# Patient Record
Sex: Male | Born: 1970
Health system: Southern US, Community
[De-identification: ages and names within clinical notes are randomized; demographics above are authoritative.]

## PROBLEM LIST (undated history)

## (undated) DIAGNOSIS — Z8669 Personal history of other diseases of the nervous system and sense organs: Secondary | ICD-10-CM

## (undated) DIAGNOSIS — J45909 Unspecified asthma, uncomplicated: Secondary | ICD-10-CM

## (undated) DIAGNOSIS — E785 Hyperlipidemia, unspecified: Secondary | ICD-10-CM

## (undated) HISTORY — PX: WISDOM TOOTH EXTRACTION: SHX21

## (undated) HISTORY — DX: Hyperlipidemia, unspecified: E78.5

## (undated) HISTORY — DX: Personal history of other diseases of the nervous system and sense organs: Z86.69

## (undated) HISTORY — DX: Unspecified asthma, uncomplicated: J45.909

---

## 2017-12-03 ENCOUNTER — Encounter: Payer: Self-pay | Admitting: Family Medicine

## 2017-12-03 ENCOUNTER — Ambulatory Visit (INDEPENDENT_AMBULATORY_CARE_PROVIDER_SITE_OTHER): Payer: Self-pay | Admitting: Family Medicine

## 2017-12-03 VITALS — BP 122/78 | HR 59 | Temp 98.8°F | Wt 234.4 lb

## 2017-12-03 DIAGNOSIS — R059 Cough, unspecified: Secondary | ICD-10-CM

## 2017-12-03 DIAGNOSIS — R05 Cough: Secondary | ICD-10-CM

## 2017-12-03 DIAGNOSIS — Z8709 Personal history of other diseases of the respiratory system: Secondary | ICD-10-CM

## 2017-12-03 DIAGNOSIS — F429 Obsessive-compulsive disorder, unspecified: Secondary | ICD-10-CM

## 2017-12-03 DIAGNOSIS — Z760 Encounter for issue of repeat prescription: Secondary | ICD-10-CM

## 2017-12-03 MED ORDER — MONTELUKAST SODIUM 10 MG PO TABS
10.0000 mg | ORAL_TABLET | Freq: Every day | ORAL | 0 refills | Status: DC
Start: 2017-12-03 — End: 2017-12-17

## 2017-12-03 MED ORDER — CITALOPRAM HYDROBROMIDE 40 MG PO TABS: 40.0000 mg | ORAL_TABLET | Freq: Every day | ORAL | 0 refills | Status: DC

## 2017-12-03 NOTE — Patient Instructions (Addendum)
Montelukast oral tablets What is this medicine? MONTELUKAST (mon te LOO kast) is used to prevent and treat the symptoms of asthma. It is also used to treat allergies. Do not use for an acute asthma attack. This medicine may be used for other purposes; ask your health care provider or pharmacist if you have questions. COMMON BRAND NAME(S): Singulair What should I tell my health care provider before I take this medicine? They need to know if you have any of these conditions: -liver disease -an unusual or allergic reaction to montelukast, other medicines, foods, dyes, or preservatives -pregnant or trying to get pregnant -breast-feeding How should I use this medicine? This medicine should be given by mouth. Follow the directions on the prescription label. Take this medicine at the same time every day. You may take this medicine with or without meals. Do not chew the tablets. Do not stop taking your medicine unless your doctor tells you to. Talk to your pediatrician regarding the use of this medicine in children. Special care may be needed. While this drug may be prescribed for children as young as 15 years of age for selected conditions, precautions do apply. Overdosage: If you think you have taken too much of this medicine contact a poison control center or emergency room at once. NOTE: This medicine is only for you. Do not share this medicine with others. What if I miss a dose? If you miss a dose, take it as soon as you can. If it is almost time for your next dose, take only that dose. Do not take double or extra doses. What may interact with this medicine? -anti-infectives like rifampin and rifabutin -medicines for diabetes like rosiglitazone and repaglinide -medicines for seizures like phenytoin, phenobarbital, and carbamazepine -paclitaxel This list may not describe all possible interactions. Give your health care provider a list of all the medicines, herbs, non-prescription drugs, or  dietary supplements you use. Also tell them if you smoke, drink alcohol, or use illegal drugs. Some items may interact with your medicine. What should I watch for while using this medicine? Visit your doctor or health care professional for regular checks on your progress. Tell your doctor or health care professional if your allergy or asthma symptoms do not improve. Take your medicine even when you do not have symptoms. Do not stop taking any of your medicine(s) unless your doctor tells you to. If you have asthma, talk to your doctor about what to do in an acute asthma attack. Always have your inhaled rescue medicine for asthma attacks with you. Patients and their families should watch for new or worsening thoughts of suicide or depression. Also watch for sudden changes in feelings such as feeling anxious, agitated, panicky, irritable, hostile, aggressive, impulsive, severely restless, overly excited and hyperactive, or not being able to sleep. Any worsening of mood or thoughts of suicide or dying should be reported to your health care professional right away. What side effects may I notice from receiving this medicine? Side effects that you should report to your doctor or health care professional as soon as possible: -allergic reactions like skin rash or hives, or swelling of the face, lips, or tongue -breathing problems -confusion -dark urine -fever or infection -flu-like symptoms -hallucinations -painful lumps under the skin -pain, tingling, numbness in the hands or feet -sinus pain or swelling -suicidal thoughts or other mood changes -tremors -trouble sleeping -uncontrolled muscle movements -unusual bleeding or bruising -yellowing of the eyes or skin Side effects that usually do not require   medical attention (report to your doctor or health care professional if they continue or are bothersome): -cough -dizziness -drowsiness -headache -nightmares -stomach upset -stuffy nose This list may  not describe all possible side effects. Call your doctor for medical advice about side effects. You may report side effects to FDA at 1-800-FDA-1088. Where should I keep my medicine? Keep out of the reach of children. Store at room temperature between 15 and 30 degrees C (59 and 86 degrees F). Protect from light and moisture. Keep this medicine in the original bottle. Throw away any unused medicine after the expiration date. NOTE: This sheet is a summary. It may not cover all possible information. If you have questions about this medicine, talk to your doctor, pharmacist, or health care provider.  2018 Elsevier/Gold Standard (2015-07-18 09:40:44) Citalopram tablets What is this medicine? CITALOPRAM (sye TAL oh pram) is a medicine for depression. This medicine may be used for other purposes; ask your health care provider or pharmacist if you have questions. COMMON BRAND NAME(S): Celexa What should I tell my health care provider before I take this medicine? They need to know if you have any of these conditions: -bleeding disorders -bipolar disorder or a family history of bipolar disorder -glaucoma -heart disease -history of irregular heartbeat -kidney disease -liver disease -low levels of magnesium or potassium in the blood -receiving electroconvulsive therapy -seizures -suicidal thoughts, plans, or attempt; a previous suicide attempt by you or a family member -take medicines that treat or prevent blood clots -thyroid disease -an unusual or allergic reaction to citalopram, escitalopram, other medicines, foods, dyes, or preservatives -pregnant or trying to become pregnant -breast-feeding How should I use this medicine? Take this medicine by mouth with a glass of water. Follow the directions on the prescription label. You can take it with or without food. Take your medicine at regular intervals. Do not take your medicine more often than directed. Do not stop taking this medicine suddenly  except upon the advice of your doctor. Stopping this medicine too quickly may cause serious side effects or your condition may worsen. A special MedGuide will be given to you by the pharmacist with each prescription and refill. Be sure to read this information carefully each time. Talk to your pediatrician regarding the use of this medicine in children. Special care may be needed. Patients over 25 years old may have a stronger reaction and need a smaller dose. Overdosage: If you think you have taken too much of this medicine contact a poison control center or emergency room at once. NOTE: This medicine is only for you. Do not share this medicine with others. What if I miss a dose? If you miss a dose, take it as soon as you can. If it is almost time for your next dose, take only that dose. Do not take double or extra doses. What may interact with this medicine? Do not take this medicine with any of the following medications: -certain medicines for fungal infections like fluconazole, itraconazole, ketoconazole, posaconazole, voriconazole -cisapride -dofetilide -dronedarone -escitalopram -linezolid -MAOIs like Carbex, Eldepryl, Marplan, Nardil, and Parnate -methylene blue (injected into a vein) -pimozide -thioridazine -ziprasidone This medicine may also interact with the following medications: -alcohol -amphetamines -aspirin and aspirin-like medicines -carbamazepine -certain medicines for depression, anxiety, or psychotic disturbances -certain medicines for infections like chloroquine, clarithromycin, erythromycin, furazolidone, isoniazid, pentamidine -certain medicines for migraine headaches like almotriptan, eletriptan, frovatriptan, naratriptan, rizatriptan, sumatriptan, zolmitriptan -certain medicines for sleep -certain medicines that treat or prevent blood clots like dalteparin,  enoxaparin, warfarin -cimetidine -diuretics -fentanyl -lithium -methadone -metoprolol -NSAIDs,  medicines for pain and inflammation, like ibuprofen or naproxen -omeprazole -other medicines that prolong the QT interval (cause an abnormal heart rhythm) -procarbazine -rasagiline -supplements like St. John's wort, kava kava, valerian -tramadol -tryptophan This list may not describe all possible interactions. Give your health care provider a list of all the medicines, herbs, non-prescription drugs, or dietary supplements you use. Also tell them if you smoke, drink alcohol, or use illegal drugs. Some items may interact with your medicine. What should I watch for while using this medicine? Tell your doctor if your symptoms do not get better or if they get worse. Visit your doctor or health care professional for regular checks on your progress. Because it may take several weeks to see the full effects of this medicine, it is important to continue your treatment as prescribed by your doctor. Patients and their families should watch out for new or worsening thoughts of suicide or depression. Also watch out for sudden changes in feelings such as feeling anxious, agitated, panicky, irritable, hostile, aggressive, impulsive, severely restless, overly excited and hyperactive, or not being able to sleep. If this happens, especially at the beginning of treatment or after a change in dose, call your health care professional. Dennis Bast may get drowsy or dizzy. Do not drive, use machinery, or do anything that needs mental alertness until you know how this medicine affects you. Do not stand or sit up quickly, especially if you are an older patient. This reduces the risk of dizzy or fainting spells. Alcohol may interfere with the effect of this medicine. Avoid alcoholic drinks. Your mouth may get dry. Chewing sugarless gum or sucking hard candy, and drinking plenty of water will help. Contact your doctor if the problem does not go away or is severe. What side effects may I notice from receiving this medicine? Side effects  that you should report to your doctor or health care professional as soon as possible: -allergic reactions like skin rash, itching or hives, swelling of the face, lips, or tongue -anxious -black, tarry stools -breathing problems -changes in vision -chest pain -confusion -elevated mood, decreased need for sleep, racing thoughts, impulsive behavior -eye pain -fast, irregular heartbeat -feeling faint or lightheaded, falls -feeling agitated, angry, or irritable -hallucination, loss of contact with reality -loss of balance or coordination -loss of memory -painful or prolonged erections -restlessness, pacing, inability to keep still -seizures -stiff muscles -suicidal thoughts or other mood changes -trouble sleeping -unusual bleeding or bruising -unusually weak or tired -vomiting Side effects that usually do not require medical attention (report to your doctor or health care professional if they continue or are bothersome): -change in appetite or weight -change in sex drive or performance -dizziness -headache -increased sweating -indigestion, nausea -tremors This list may not describe all possible side effects. Call your doctor for medical advice about side effects. You may report side effects to FDA at 1-800-FDA-1088. Where should I keep my medicine? Keep out of reach of children. Store at room temperature between 15 and 30 degrees C (59 and 86 degrees F). Throw away any unused medicine after the expiration date. NOTE: This sheet is a summary. It may not cover all possible information. If you have questions about this medicine, talk to your doctor, pharmacist, or health care provider.  2018 Elsevier/Gold Standard (2015-12-19 13:18:52)

## 2017-12-03 NOTE — Progress Notes (Signed)
Antonio House is a 47 y.o. male who presents today with concerns of need for a medication refill. Patient has a history of OCD and report trialing other medications and this is the only one that works for him. He is new to the area and is pending his new patient evaluation on the 21st of this month. He also is concerned for a cough that began in the last few days- described as non productive but patient reports a history of asthma with a prescribed oral inhaled steroid Symbicort that he is non compliant with daily use.  Review of Systems  Constitutional: Negative for chills, fever and malaise/fatigue.  HENT: Negative for congestion, ear discharge, ear pain, sinus pain and sore throat.   Eyes: Negative.   Respiratory: Positive for cough. Negative for sputum production and shortness of breath.   Cardiovascular: Negative.  Negative for chest pain.  Gastrointestinal: Negative for abdominal pain, diarrhea, nausea and vomiting.  Genitourinary: Negative for dysuria, frequency, hematuria and urgency.  Musculoskeletal: Negative for myalgias.  Skin: Negative.   Neurological: Negative for headaches.  Endo/Heme/Allergies: Negative.   Psychiatric/Behavioral: Negative.     O: Vitals:   12/03/17 1359  BP: 122/78  Pulse: (!) 59  Temp: 98.8 F (37.1 C)  SpO2: 96%     Physical Exam  Constitutional: He is oriented to person, place, and time. Vital signs are normal. He appears well-developed and well-nourished. He is active.  Non-toxic appearance. He does not have a sickly appearance.  HENT:  Head: Normocephalic.  Right Ear: Hearing, tympanic membrane, external ear and ear canal normal.  Left Ear: Hearing, tympanic membrane, external ear and ear canal normal.  Nose: Nose normal.  Mouth/Throat: Uvula is midline and oropharynx is clear and moist.  Neck: Normal range of motion. Neck supple.  Cardiovascular: Normal rate, regular rhythm, normal heart sounds and normal pulses.  Pulmonary/Chest: Effort  normal and breath sounds normal.  Abdominal: Soft. Bowel sounds are normal.  Musculoskeletal: Normal range of motion.  Lymphadenopathy:       Head (right side): No submental and no submandibular adenopathy present.       Head (left side): No submental and no submandibular adenopathy present.    He has no cervical adenopathy.  Neurological: He is alert and oriented to person, place, and time.  Psychiatric: He has a normal mood and affect.  Vitals reviewed. Frequent dry cough observed on exam- non- productive- deep quality. Lungs clear throughout.   A: 1. Encounter for medication refill   2. Cough   3. Obsessive-compulsive disorder, unspecified type   4. History of asthma      P: Discussed use of previously prescribed asthma regimen and addition of Singulair nightly for cough symptoms. Medication refill provided for Celexa x 14 days. Exam findings, diagnosis etiology and medication use and indications reviewed with patient. Follow- Up and discharge instructions provided. No emergent/urgent issues found on exam.  Patient verbalized understanding of information provided and agrees with plan of care (POC), all questions answered.  1. Encounter for medication refill - citalopram (CELEXA) 40 MG tablet; Take 1 tablet (40 mg total) by mouth daily.  2. Cough - montelukast (SINGULAIR) 10 MG tablet; Take 1 tablet (10 mg total) by mouth at bedtime.  3. Obsessive-compulsive disorder, unspecified type - citalopram (CELEXA) 40 MG tablet; Take 1 tablet (40 mg total) by mouth daily.  4. History of asthma - montelukast (SINGULAIR) 10 MG tablet; Take 1 tablet (10 mg total) by mouth at bedtime.  Other  orders - citalopram (CELEXA) 40 MG tablet; Take 40 mg by mouth daily.

## 2017-12-17 ENCOUNTER — Ambulatory Visit: Payer: Self-pay | Admitting: Family Medicine

## 2017-12-17 ENCOUNTER — Encounter: Payer: Self-pay | Admitting: Family Medicine

## 2017-12-17 VITALS — BP 122/84 | HR 64 | Temp 98.8°F | Resp 12 | Ht 75.0 in | Wt 236.0 lb

## 2017-12-17 DIAGNOSIS — J4520 Mild intermittent asthma, uncomplicated: Secondary | ICD-10-CM

## 2017-12-17 DIAGNOSIS — G43109 Migraine with aura, not intractable, without status migrainosus: Secondary | ICD-10-CM

## 2017-12-17 DIAGNOSIS — E785 Hyperlipidemia, unspecified: Secondary | ICD-10-CM | POA: Insufficient documentation

## 2017-12-17 DIAGNOSIS — F429 Obsessive-compulsive disorder, unspecified: Secondary | ICD-10-CM | POA: Diagnosis not present

## 2017-12-17 LAB — BASIC METABOLIC PANEL
BUN: 13 mg/dL (ref 6–23)
CO2: 27 mEq/L (ref 19–32)
Calcium: 9.5 mg/dL (ref 8.4–10.5)
Chloride: 105 mEq/L (ref 96–112)
Creatinine, Ser: 1 mg/dL (ref 0.40–1.50)
GFR: 85.25 mL/min (ref >60.00)
Glucose, Bld: 82 mg/dL (ref 70–99)
POTASSIUM: 4.4 meq/L (ref 3.5–5.1)
Sodium: 139 mEq/L (ref 135–145)

## 2017-12-17 LAB — LIPID PANEL
CHOLESTEROL: 207 mg/dL — AB (ref 0–200)
HDL: 43.4 mg/dL (ref >39.00)
LDL Cholesterol: 138 mg/dL — ABNORMAL HIGH (ref 0–99)
NonHDL: 163.28
Total CHOL/HDL Ratio: 5
Triglycerides: 128 mg/dL (ref 0.0–149.0)
VLDL: 25.6 mg/dL (ref 0.0–40.0)

## 2017-12-17 MED ORDER — CITALOPRAM HYDROBROMIDE 40 MG PO TABS: 40.0000 mg | ORAL_TABLET | Freq: Every day | ORAL | 3 refills | Status: DC

## 2017-12-17 MED ORDER — ALBUTEROL SULFATE HFA 108 (90 BASE) MCG/ACT IN AERS
2.0000 | INHALATION_SPRAY | Freq: Four times a day (QID) | RESPIRATORY_TRACT | 2 refills | Status: DC | PRN
Start: 2017-12-17 — End: 2018-10-02

## 2017-12-17 NOTE — Patient Instructions (Addendum)
A few things to remember from today's visit:   Hyperlipidemia, unspecified hyperlipidemia type - Plan: Basic metabolic panel, Lipid panel  Encounter for medication refill - Plan: citalopram (CELEXA) 40 MG tablet  Obsessive-compulsive disorder, unspecified type - Plan: citalopram (CELEXA) 40 MG tablet   Please be sure medication list is accurate. If a new problem present, please set up appointment sooner than planned today.

## 2017-12-17 NOTE — Progress Notes (Signed)
HPI:   [name] is a 47 y.o. male, who is here today to establish care.  Former PCP: Ms Antonio House Last preventive routine visit: 1-2 years ago.  Chronic medical problems:  Hyperlipidemia, last checked 1 to 2 years ago. He does not exercise regularly. He tries to follow a healthy diet in general.  Migraine headaches, exacerbated by stress.  He has one episode per month, no associated nausea or vomiting, headache is preceded by hemianopsia that last 20 to 30 minutes.  Headache is associated with photophobia and mild.  OCD, currently he is on Celexa 40 mg daily, week he has taken for 10 to 15 years.  He followed with psychiatrist in the past and for the past year or so he has been following with PCP. He tried all other medications but they did not help.  Asthma: He still uses Albuterol inhaler as needed, usually exercise-induced. He is on Symbicort, he is not sure about dose, twice daily.  Negative for cough, wheezing, dyspnea, chest pain.  Today he is concerned about a skin lesion on his right arm.  He has had this for 1 to 2 years and has been evaluated in the past. He has not noted changes.  No easy bleeding or pruritus.   Review of Systems  Constitutional: Negative for activity change, appetite change, fatigue and fever.  HENT: Negative for nosebleeds, sore throat and trouble swallowing.   Eyes: Negative for redness and visual disturbance.  Respiratory: Negative for cough, shortness of breath and wheezing.   Cardiovascular: Negative for chest pain, palpitations and leg swelling.  Gastrointestinal: Negative for abdominal pain, nausea and vomiting.  Endocrine: Negative for polydipsia, polyphagia and polyuria.  Genitourinary: Negative for decreased urine volume and hematuria.  Musculoskeletal: Negative for gait problem and myalgias.  Allergic/Immunologic: Positive for environmental allergies.  Neurological: Negative for weakness and headaches.    Psychiatric/Behavioral: Negative for confusion and suicidal ideas. The patient is not nervous/anxious.       No current outpatient medications on file prior to visit.   No current facility-administered medications on file prior to visit.      Past Medical History:  Diagnosis Date  . Asthma   . Hx of migraines   . Hyperlipidemia    Allergies  Allergen Reactions  . Prozac [Fluoxetine Hcl] Hives    Family History  Problem Relation Age of Onset  . Cancer Mother   . Early death Mother   . Hypertension Mother   . COPD Father   . Heart disease Father   . Stroke Maternal Grandmother   . Alcohol abuse Maternal Grandfather   . Birth defects Maternal Grandfather   . Early death Maternal Grandfather   . Heart disease Paternal Grandmother   . Stroke Paternal Grandfather     Social History   Socioeconomic History  . Marital status: Married    Spouse name: Not on file  . Number of children: Not on file  . Years of education: Not on file  . Highest education level: Not on file  Occupational History  . Not on file  Social Needs  . Financial resource strain: Not on file  . Food insecurity:    Worry: Not on file    Inability: Not on file  . Transportation needs:    Medical: Not on file    Non-medical: Not on file  Tobacco Use  . Smoking status: Never Smoker  . Smokeless tobacco: Never Used  Substance and Sexual Activity  .  Alcohol use: Yes  . Drug use: Never  . Sexual activity: Yes    Partners: Female  Lifestyle  . Physical activity:    Days per week: Not on file    Minutes per session: Not on file  . Stress: Not on file  Relationships  . Social connections:    Talks on phone: Not on file    Gets together: Not on file    Attends religious service: Not on file    Active member of club or organization: Not on file    Attends meetings of clubs or organizations: Not on file    Relationship status: Not on file  Other Topics Concern  . Not on file  Social  History Narrative  . Not on file    Vitals:   12/17/17 1010  BP: 122/84  Pulse: 64  Resp: 12  Temp: 98.8 F (37.1 C)  SpO2: 97%    Body mass index is 29.5 kg/m.    Physical Exam  Nursing note and vitals reviewed. Constitutional: He is oriented to person, place, and time. He appears well-developed. No distress.  HENT:  Head: Normocephalic and atraumatic.  Mouth/Throat: Oropharynx is clear and moist and mucous membranes are normal.  Eyes: Pupils are equal, round, and reactive to light. Conjunctivae are normal.  Cardiovascular: Normal rate and regular rhythm.  No murmur heard. Pulses:      Dorsalis pedis pulses are 2+ on the right side, and 2+ on the left side.  Respiratory: Effort normal and breath sounds normal. No respiratory distress.  GI: Soft. He exhibits no mass. There is no hepatomegaly. There is no tenderness.  Musculoskeletal: He exhibits no edema.  Lymphadenopathy:    He has no cervical adenopathy.  Neurological: He is alert and oriented to person, place, and time. He has normal strength. Gait normal.  Skin: Skin is warm. No rash noted. No erythema.  No suspicious lesions. 2 to 3 mm slightly hyperpigmented and slightly raised lesion, defined borders on forearm,proxima radial aspect.  Psychiatric: He has a normal mood and affect.  Well groomed, good eye contact.      ASSESSMENT AND PLAN:  [name] was seen today for establish care.  Diagnoses and all orders for this visit:  Lab Results  Component Value Date   CHOL 207 (H) 12/17/2017   HDL 43.40 12/17/2017   LDLCALC 138 (H) 12/17/2017   TRIG 128.0 12/17/2017   CHOLHDL 5 12/17/2017   Lab Results  Component Value Date   CREATININE 1.00 12/17/2017   BUN 13 12/17/2017   NA 139 12/17/2017   K 4.4 12/17/2017   CL 105 12/17/2017   CO2 27 12/17/2017    Hyperlipidemia, unspecified hyperlipidemia type  He will continue nonpharmacologic treatment for now. Further recommendations will be given  according to lab results.  The 10-year ASCVD risk score Mikey Bussing DC Brooke Bonito., et al., 2013) is: 2.6%   Values used to calculate the score:     Age: 35 years     Sex: Male     Is Non-Hispanic African American: No     Diabetic: No     Tobacco smoker: No     Systolic Blood Pressure: 062 mmHg     Is BP treated: No     HDL Cholesterol: 43.4 mg/dL     Total Cholesterol: 207 mg/dL  -     Basic metabolic panel -     Lipid panel  Obsessive-compulsive disorder, unspecified type  Well-controlled with Celexa 40 mg daily.  Problem has been a stable for over 10 years, so I think is appropriate to follow annually, before if needed.  -     citalopram (CELEXA) 40 MG tablet; Take 1 tablet (40 mg total) by mouth daily.  Mild intermittent asthma without complication  Well-controlled. He will let me know dose of Symbicort. No changes in Albuterol inhaler. Follow-up annually, before if needed.  -     albuterol (PROVENTIL HFA;VENTOLIN HFA) 108 (90 Base) MCG/ACT inhaler; Inhale 2 puffs into the lungs every 6 (six) hours as needed for wheezing or shortness of breath. -     budesonide-formoterol (SYMBICORT) 160-4.5 MCG/ACT inhaler; Inhale 2 puffs into the lungs 2 (two) times daily.   Migraine with aura and without status migrainosus, not intractable  Stable for years. Follow-up as needed.     In regard to skin lesion, he was reassured.No suspicious. He will continue monitoring for changes.       Shauntavia Brackin G. [place], MD  [facility]. [place] office.

## 2017-12-21 ENCOUNTER — Encounter: Payer: Self-pay | Admitting: Family Medicine

## 2017-12-22 ENCOUNTER — Encounter: Payer: Self-pay | Admitting: Family Medicine

## 2017-12-22 DIAGNOSIS — J4520 Mild intermittent asthma, uncomplicated: Secondary | ICD-10-CM | POA: Insufficient documentation

## 2017-12-22 DIAGNOSIS — F429 Obsessive-compulsive disorder, unspecified: Secondary | ICD-10-CM | POA: Insufficient documentation

## 2017-12-22 MED ORDER — BUDESONIDE-FORMOTEROL FUMARATE 160-4.5 MCG/ACT IN AERO: 2.0000 | INHALATION_SPRAY | Freq: Two times a day (BID) | RESPIRATORY_TRACT | 3 refills | Status: DC

## 2018-01-14 NOTE — Progress Notes (Signed)
HPI:  [name] is a 47 y.o.male here today for his routine physical examination.  Last CPE: about 2 years ago. He lives with his wife and 2 children 33 and 2 yo.  Regular exercise 3 or more times per week: Not consistently. Following a healthy diet: In general he tries to follow a healthy diet.   Chronic medical problems: HLD,OCD,and migraine headache.  Hx of STD's: Denies   There is no immunization history on file for this patient.   -Denies high alcohol intake, tobacco use, or Hx of illicit drug use.  -Concerns today:   Scaly dry skin and erythematosus rash in between brows, nasolabial fold, and behind ears. It is pruritic.  Used to have same problem on scalp 10 years.  He used OTC hydrocortisone,which helped.  No new medication, sick contact, or tender rash.  It seems to be exacerbated by stress.   He also mentions that his wife has been concerned about possible anemia.  He states that sometimes he complains about being tired, no more than usual.    Review of Systems  Constitutional: Positive for fatigue (No more than usual.). Negative for activity change, appetite change, fever and unexpected weight change.  HENT: Negative for dental problem, nosebleeds, sore throat, trouble swallowing and voice change.   Eyes: Negative for redness and visual disturbance.  Respiratory: Negative for apnea, cough, shortness of breath and wheezing.   Cardiovascular: Negative for chest pain, palpitations and leg swelling.  Gastrointestinal: Negative for abdominal pain, blood in stool, nausea and vomiting.  Endocrine: Negative for cold intolerance, heat intolerance, polydipsia, polyphagia and polyuria.  Genitourinary: Negative for decreased urine volume, dysuria, genital sores, hematuria and testicular pain.  Musculoskeletal: Negative for gait problem, joint swelling and myalgias.  Skin: Positive for rash. Negative for wound.  Allergic/Immunologic: Positive for  environmental allergies.  Neurological: Negative for syncope, weakness and headaches.  Hematological: Negative for adenopathy. Does not bruise/bleed easily.  Psychiatric/Behavioral: Negative for confusion and sleep disturbance. The patient is not nervous/anxious.   All other systems reviewed and are negative.    Current Outpatient Medications on File Prior to Visit  Medication Sig Dispense Refill  . albuterol (PROVENTIL HFA;VENTOLIN HFA) 108 (90 Base) MCG/ACT inhaler Inhale 2 puffs into the lungs every 6 (six) hours as needed for wheezing or shortness of breath. 3 Inhaler 2  . citalopram (CELEXA) 40 MG tablet Take 1 tablet (40 mg total) by mouth daily. 90 tablet 3  . budesonide-formoterol (SYMBICORT) 160-4.5 MCG/ACT inhaler Inhale 2 puffs into the lungs 2 (two) times daily. (Patient not taking: Reported on 01/15/2018) 3 Inhaler 3   No current facility-administered medications on file prior to visit.      Past Medical History:  Diagnosis Date  . Asthma   . Hx of migraines   . Hyperlipidemia     History reviewed. No pertinent surgical history.  Allergies  Allergen Reactions  . Prozac [Fluoxetine Hcl] Hives    Family History  Problem Relation Age of Onset  . Cancer Mother   . Early death Mother   . Hypertension Mother   . COPD Father   . Heart disease Father   . Stroke Maternal Grandmother   . Alcohol abuse Maternal Grandfather   . Birth defects Maternal Grandfather   . Early death Maternal Grandfather   . Heart disease Paternal Grandmother   . Stroke Paternal Grandfather     Social History   Socioeconomic History  . Marital status: Married  Spouse name: Not on file  . Number of children: Not on file  . Years of education: Not on file  . Highest education level: Not on file  Occupational History  . Not on file  Social Needs  . Financial resource strain: Not on file  . Food insecurity:    Worry: Not on file    Inability: Not on file  . Transportation needs:      Medical: Not on file    Non-medical: Not on file  Tobacco Use  . Smoking status: Never Smoker  . Smokeless tobacco: Never Used  Substance and Sexual Activity  . Alcohol use: Yes  . Drug use: Never  . Sexual activity: Yes    Partners: Female  Lifestyle  . Physical activity:    Days per week: Not on file    Minutes per session: Not on file  . Stress: Not on file  Relationships  . Social connections:    Talks on phone: Not on file    Gets together: Not on file    Attends religious service: Not on file    Active member of club or organization: Not on file    Attends meetings of clubs or organizations: Not on file    Relationship status: Not on file  Other Topics Concern  . Not on file  Social History Narrative  . Not on file     Vitals:   01/15/18 0932  BP: 118/70  Pulse: 60  Resp: 12  Temp: 98.7 F (37.1 C)  SpO2: 97%   Body mass index is 29.64 kg/m.   Wt Readings from Last 3 Encounters:  01/15/18 237 lb 2 oz (107.6 kg)  12/17/17 236 lb (107 kg)  12/03/17 234 lb 6.4 oz (106.3 kg)    Physical Exam  Nursing note and vitals reviewed. Constitutional: He is oriented to person, place, and time. He appears well-developed. No distress.  HENT:  Head: Normocephalic and atraumatic.  Mouth/Throat: Oropharynx is clear and moist and mucous membranes are normal.  Eyes: Pupils are equal, round, and reactive to light. Conjunctivae and EOM are normal.  Neck: Normal range of motion. No tracheal deviation present. No thyromegaly present.  Cardiovascular: Normal rate and regular rhythm.  No murmur heard. Pulses:      Dorsalis pedis pulses are 2+ on the right side, and 2+ on the left side.  Respiratory: Effort normal and breath sounds normal. No respiratory distress.  GI: Soft. He exhibits no mass. There is no tenderness.  Genitourinary:  Genitourinary Comments: Refused,no concerns.  Musculoskeletal: He exhibits no edema or tenderness.  No major deformities appreciated  and no signs of synovitis.  Lymphadenopathy:    He has no cervical adenopathy.       Right: No supraclavicular adenopathy present.       Left: No supraclavicular adenopathy present.  Neurological: He is alert and oriented to person, place, and time. He has normal strength. No cranial nerve deficit or sensory deficit. Coordination and gait normal.  Skin: Skin is warm. Rash noted. Rash is macular. No erythema.  Erythematous, macular, mildly scaly localized in between eyebrows and nasal labial folds. No induration or tenderness.   Psychiatric: He has a normal mood and affect. Cognition and memory are normal.  Well-groomed, good eye contact.    ASSESSMENT AND PLAN:  [name] was seen today for annual exam.  Orders Placed This Encounter  Procedures  . Tdap vaccine greater than or equal to 7yo IM  . CBC with  Differential/Platelet  . TSH    Lab Results  Component Value Date   WBC 5.4 01/15/2018   HGB 14.2 01/15/2018   HCT 42.3 01/15/2018   MCV 88.0 01/15/2018   PLT 240.0 01/15/2018   Lab Results  Component Value Date   TSH 0.85 01/15/2018    Routine general medical examination at a health care facility  We discussed the importance of regular physical activity and healthy diet for prevention of chronic illness and/or complications. Preventive guidelines reviewed. Vaccination updated.  Next CPE in a year.  Fatigue, unspecified type  It seems to be chronic. Further recommendation will be given according to lab results.  -     CBC with Differential/Platelet -     TSH  Seborrheic dermatitis, unspecified  Educated about diagnosis. OTC hydrocortisone, small amount daily as needed and no longer than 14 days at the time. Ketoconazole cream once daily on affected area may also help. If not improving we can ask for dermatology evaluation.  -     ketoconazole (NIZORAL) 2 % cream; Apply 1 application topically daily.  Need for Tdap vaccination -     Tdap vaccine greater  than or equal to 7yo IM   Return in 1 year (on 01/16/2019) for CPE.    Alyzah Pelly G. [place], MD  [facility]. [place] office.

## 2018-01-15 ENCOUNTER — Ambulatory Visit (INDEPENDENT_AMBULATORY_CARE_PROVIDER_SITE_OTHER): Payer: 59 | Admitting: Family Medicine

## 2018-01-15 ENCOUNTER — Encounter: Payer: Self-pay | Admitting: Family Medicine

## 2018-01-15 VITALS — BP 118/70 | HR 60 | Temp 98.7°F | Resp 12 | Ht 75.0 in | Wt 237.1 lb

## 2018-01-15 DIAGNOSIS — R5383 Other fatigue: Secondary | ICD-10-CM

## 2018-01-15 DIAGNOSIS — Z0000 Encounter for general adult medical examination without abnormal findings: Secondary | ICD-10-CM

## 2018-01-15 DIAGNOSIS — Z23 Encounter for immunization: Secondary | ICD-10-CM | POA: Diagnosis not present

## 2018-01-15 DIAGNOSIS — L219 Seborrheic dermatitis, unspecified: Secondary | ICD-10-CM

## 2018-01-15 LAB — CBC WITH DIFFERENTIAL/PLATELET
BASOS ABS: 0.1 10*3/uL (ref 0.0–0.1)
BASOS PCT: 1.1 % (ref 0.0–3.0)
EOS ABS: 0.2 10*3/uL (ref 0.0–0.7)
Eosinophils Relative: 3.9 % (ref 0.0–5.0)
HEMATOCRIT: 42.3 % (ref 39.0–52.0)
Hemoglobin: 14.2 g/dL (ref 13.0–17.0)
LYMPHS ABS: 1.6 10*3/uL (ref 0.7–4.0)
Lymphocytes Relative: 29.5 % (ref 12.0–46.0)
MCHC: 33.6 g/dL (ref 30.0–36.0)
MCV: 88 fl (ref 78.0–100.0)
Monocytes Absolute: 0.5 10*3/uL (ref 0.1–1.0)
Monocytes Relative: 9.4 % (ref 3.0–12.0)
NEUTROS ABS: 3 10*3/uL (ref 1.4–7.7)
NEUTROS PCT: 56.1 % (ref 43.0–77.0)
PLATELETS: 240 10*3/uL (ref 150.0–400.0)
RBC: 4.81 Mil/uL (ref 4.22–5.81)
RDW: 13.7 % (ref 11.5–15.5)
WBC: 5.4 10*3/uL (ref 4.0–10.5)

## 2018-01-15 LAB — TSH: TSH: 0.85 u[IU]/mL (ref 0.35–4.50)

## 2018-01-15 MED ORDER — KETOCONAZOLE 2 % EX CREA: 1.0000 "application " | TOPICAL_CREAM | Freq: Every day | CUTANEOUS | 6 refills | Status: DC

## 2018-01-15 NOTE — Patient Instructions (Addendum)
A few things to remember from today's visit:   Routine general medical examination at a health care facility  Fatigue, unspecified type - Plan: CBC with Differential/Platelet, TSH  Seborrheic dermatitis, unspecified - Plan: ketoconazole (NIZORAL) 2 % cream   At least 150 minutes of moderate exercise per week, daily brisk walking for 15-30 min is a good exercise option. Healthy diet low in saturated (animal) fats and sweets and consisting of fresh fruits and vegetables, lean meats such as fish and white chicken and whole grains.  - Vaccines:  Tdap vaccine every 10 years.  Shingles vaccine recommended at age 66, could be given after 47 years of age but not sure about insurance coverage.  Pneumonia vaccines:  Prevnar 11 at 56 and Pneumovax at 27.   -Screening recommendations for low/normal risk males:  Screening for diabetes at age 66 and every 3 years. Earlier screening if cardiovascular risk factors.   Lipid screening at 35 and every 3 years. Screening starts in younger males with cardiovascular risk factors.  Colon cancer screening at age 23 and until age 32.  Prostate cancer screening: some controversy, starts usually at 55: Rectal exam and PSA.  Aortic Abdominal Aneurism once between 83 and 83 years old if ever smoker.  Also recommended:  1. Dental visit- Brush and floss your teeth twice daily; visit your dentist twice a year. 2. Eye doctor- Get an eye exam at least every 2 years. 3. Helmet use- Always wear a helmet when riding a bicycle, motorcycle, rollerblading or skateboarding. 4. Safe sex- If you may be exposed to sexually transmitted infections, use a condom. 5. Seat belts- Seat belts can save your live; always wear one. 6. Smoke/Carbon Monoxide detectors- These detectors need to be installed on the appropriate level of your home. Replace batteries at least once a year. 7. Skin cancer- When out in the sun please cover up and use sunscreen 15 SPF or  higher. 8. Violence- If anyone is threatening or hurting you, please tell your healthcare provider.  9. Drink alcohol in moderation- Limit alcohol intake to one drink or less per day. Never drink and drive.  Please be sure medication list is accurate. If a new problem present, please set up appointment sooner than planned today.

## 2018-01-18 ENCOUNTER — Encounter: Payer: Self-pay | Admitting: Family Medicine

## 2018-01-31 ENCOUNTER — Ambulatory Visit (INDEPENDENT_AMBULATORY_CARE_PROVIDER_SITE_OTHER): Payer: Self-pay | Admitting: Nurse Practitioner

## 2018-01-31 VITALS — BP 110/70 | HR 58 | Temp 98.6°F | Resp 16 | Wt 233.6 lb

## 2018-01-31 DIAGNOSIS — J209 Acute bronchitis, unspecified: Secondary | ICD-10-CM

## 2018-01-31 MED ORDER — AZITHROMYCIN 250 MG PO TABS: ORAL_TABLET | ORAL | 0 refills | Status: AC

## 2018-01-31 MED ORDER — GUAIFENESIN-DM 100-10 MG/5ML PO SYRP: 5.0000 mL | ORAL_SOLUTION | Freq: Four times a day (QID) | ORAL | 0 refills | Status: AC | PRN

## 2018-01-31 MED ORDER — PREDNISONE 10 MG (21) PO TBPK: ORAL_TABLET | ORAL | 0 refills | Status: AC

## 2018-01-31 NOTE — Patient Instructions (Signed)

## 2018-01-31 NOTE — Progress Notes (Signed)
Subjective:  Antonio House is a 47 y.o. male who presents for evaluation of URI like symptoms.  Symptoms include non productive cough and wheezing.  Patient denies fever, chills, runny nose, sore throat, ear pain, or ear drainage.  Onset of symptoms was 7 days ago, and has been unchanged since that time.  Treatment to date:  MDI.  High risk factors for influenza complications:  co-morbid illness. Patient has a history of asthma.  The following portions of the patient's history were reviewed and updated as appropriate:  allergies, current medications and past medical history.  Constitutional: positive for fatigue, negative for anorexia, chills, fevers, malaise and sweats Eyes: negative Ears, nose, mouth, throat, and face: negative Respiratory: positive for asthma, cough and wheezing, negative for dyspnea on exertion, emphysema, hemoptysis, pleurisy/chest pain and pneumonia Cardiovascular: negative Gastrointestinal: negative Neurological: negative Allergic/Immunologic: negative Objective:  BP 110/70 (BP Location: Right Arm, Patient Position: Sitting, Cuff Size: Normal)   Pulse (!) 58   Temp 98.6 F (37 C) (Oral)   Resp 16   Wt 233 lb 9.6 oz (106 kg)   SpO2 97%   BMI 29.20 kg/m  General appearance: alert, cooperative, fatigued and no distress Head: Normocephalic, without obvious abnormality, atraumatic Eyes: conjunctivae/corneas clear. PERRL, EOM's intact. Fundi benign. Ears: normal TM's and external ear canals both ears Nose: Nares normal. Septum midline. Mucosa normal. No drainage or sinus tenderness. Throat: lips, mucosa, and tongue normal; teeth and gums normal Lungs: rhonchi RLL and RUL Heart: regular rate and rhythm, S1, S2 normal, no murmur, click, rub or gallop Abdomen: soft, non-tender; bowel sounds normal; no masses,  no organomegaly Pulses: 2+ and symmetric Skin: Skin color, texture, turgor normal. No rashes or lesions Lymph nodes: cervical and submandibular nodes  normal Neurologic: Grossly normal    Assessment:  Acute Bronchitis    Plan:  Exam findings, diagnosis etiology and medication use and indications reviewed with patient. Follow- Up and discharge instructions provided. No emergent/urgent issues found on exam.  Patient verbalized understanding of information provided and agrees with plan of care (POC), all questions answered.  1. Acute Bronchitis Meds ordered this encounter  Medications  . azithromycin (ZITHROMAX) 250 MG tablet    Sig: Take as directed.    Dispense:  6 tablet    Refill:  0    Order Specific Question:   Supervising Provider    Answer:   Ricard Dillon [[id number]]  . predniSONE (STERAPRED UNI-PAK 21 TAB) 10 MG (21) TBPK tablet    Sig: Take as directed.    Dispense:  21 tablet    Refill:  0    Order Specific Question:   Supervising Provider    Answer:   Ricard Dillon [[id number]]  . guaiFENesin-dextromethorphan (ROBITUSSIN DM) 100-10 MG/5ML syrup    Sig: Take 5 mLs by mouth every 6 (six) hours as needed for up to 7 days for cough.    Dispense:  140 mL    Refill:  0    Order Specific Question:   Supervising Provider    Answer:   Ricard Dillon [phone]

## 2018-03-11 DIAGNOSIS — L219 Seborrheic dermatitis, unspecified: Secondary | ICD-10-CM | POA: Diagnosis not present

## 2018-03-11 DIAGNOSIS — D229 Melanocytic nevi, unspecified: Secondary | ICD-10-CM | POA: Diagnosis not present

## 2018-03-13 DIAGNOSIS — H52223 Regular astigmatism, bilateral: Secondary | ICD-10-CM | POA: Diagnosis not present

## 2018-03-13 DIAGNOSIS — H524 Presbyopia: Secondary | ICD-10-CM | POA: Diagnosis not present

## 2018-03-13 DIAGNOSIS — H5213 Myopia, bilateral: Secondary | ICD-10-CM | POA: Diagnosis not present

## 2018-05-14 ENCOUNTER — Other Ambulatory Visit: Payer: Self-pay | Admitting: *Deleted

## 2018-05-14 DIAGNOSIS — F429 Obsessive-compulsive disorder, unspecified: Secondary | ICD-10-CM

## 2018-05-14 MED ORDER — CITALOPRAM HYDROBROMIDE 40 MG PO TABS: 40.0000 mg | ORAL_TABLET | Freq: Every day | ORAL | 3 refills | Status: DC

## 2018-06-11 ENCOUNTER — Ambulatory Visit (INDEPENDENT_AMBULATORY_CARE_PROVIDER_SITE_OTHER): Payer: Self-pay | Admitting: Family Medicine

## 2018-06-11 VITALS — BP 130/75 | HR 67 | Temp 98.8°F | Resp 16 | Wt 236.2 lb

## 2018-06-11 DIAGNOSIS — R059 Cough, unspecified: Secondary | ICD-10-CM

## 2018-06-11 DIAGNOSIS — R05 Cough: Secondary | ICD-10-CM

## 2018-06-11 DIAGNOSIS — R0981 Nasal congestion: Secondary | ICD-10-CM

## 2018-06-11 DIAGNOSIS — J069 Acute upper respiratory infection, unspecified: Secondary | ICD-10-CM

## 2018-06-11 DIAGNOSIS — Z8709 Personal history of other diseases of the respiratory system: Secondary | ICD-10-CM

## 2018-06-11 DIAGNOSIS — J3489 Other specified disorders of nose and nasal sinuses: Secondary | ICD-10-CM

## 2018-06-11 MED ORDER — BENZONATATE 200 MG PO CAPS: 200.0000 mg | ORAL_CAPSULE | Freq: Three times a day (TID) | ORAL | 0 refills | Status: DC | PRN

## 2018-06-11 MED ORDER — MUPIROCIN CALCIUM 2 % NA OINT: 1.0000 "application " | TOPICAL_OINTMENT | Freq: Two times a day (BID) | NASAL | 0 refills | Status: DC

## 2018-06-11 MED ORDER — AZELASTINE HCL 0.1 % NA SOLN
1.0000 | Freq: Two times a day (BID) | NASAL | 0 refills | Status: DC
Start: 2018-06-11 — End: 2019-04-16

## 2018-06-11 NOTE — Patient Instructions (Signed)
Upper Respiratory Infection, Adult Most upper respiratory infections (URIs) are caused by a virus. A URI affects the nose, throat, and upper air passages. The most common type of URI is often called "the common cold." Follow these instructions at home:  Take medicines only as told by your doctor.  Gargle warm saltwater or take cough drops to comfort your throat as told by your doctor.  Use a warm mist humidifier or inhale steam from a shower to increase air moisture. This may make it easier to breathe.  Drink enough fluid to keep your pee (urine) clear or pale yellow.  Eat soups and other clear broths.  Have a healthy diet.  Rest as needed.  Go back to work when your fever is gone or your doctor says it is okay. ? You may need to stay home longer to avoid giving your URI to others. ? You can also wear a face mask and wash your hands often to prevent spread of the virus.  Use your inhaler more if you have asthma.  Do not use any tobacco products, including cigarettes, chewing tobacco, or electronic cigarettes. If you need help quitting, ask your doctor. Contact a doctor if:  You are getting worse, not better.  Your symptoms are not helped by medicine.  You have chills.  You are getting more short of breath.  You have brown or red mucus.  You have yellow or brown discharge from your nose.  You have pain in your face, especially when you bend forward.  You have a fever.  You have puffy (swollen) neck glands.  You have pain while swallowing.  You have white areas in the back of your throat. Get help right away if:  You have very bad or constant: ? Headache. ? Ear pain. ? Pain in your forehead, behind your eyes, and over your cheekbones (sinus pain). ? Chest pain.  You have long-lasting (chronic) lung disease and any of the following: ? Wheezing. ? Long-lasting cough. ? Coughing up blood. ? A change in your usual mucus.  You have a stiff neck.  You have  changes in your: ? Vision. ? Hearing. ? Thinking. ? Mood. This information is not intended to replace advice given to you by your health care provider. Make sure you discuss any questions you have with your health care provider. Document Released: 01/02/2008 Document Revised: 03/18/2016 Document Reviewed: 10/21/2013 Elsevier Interactive Patient Education  2018 Elsevier Inc.  

## 2018-06-11 NOTE — Progress Notes (Signed)
Antonio House is a 47 y.o. male who presents today with concerns of cough and congestion for the last 4 days. He reports he has not used anything to treat this condition up to this point. Concern is that symptoms are causing sleep disturbance. He is not a smoker but does report chronic asthma that is well controlled. He is managed on a daily steroid for this condition.  Review of Systems  Constitutional: Negative for chills, fever and malaise/fatigue.  HENT: Positive for congestion. Negative for ear discharge, ear pain, sinus pain and sore throat.   Eyes: Negative.   Respiratory: Positive for cough. Negative for sputum production and shortness of breath.   Cardiovascular: Negative.  Negative for chest pain.  Gastrointestinal: Negative for abdominal pain, diarrhea, nausea and vomiting.  Genitourinary: Negative for dysuria, frequency, hematuria and urgency.  Musculoskeletal: Negative for myalgias.  Skin: Negative.   Neurological: Negative for headaches.  Endo/Heme/Allergies: Negative.   Psychiatric/Behavioral: Negative.     O: Vitals:   06/11/18 1315  BP: 130/75  Pulse: 67  Resp: 16  Temp: 98.8 F (37.1 C)  SpO2: 96%     Physical Exam  Constitutional: He is oriented to person, place, and time. Vital signs are normal. He appears well-developed and well-nourished. He is active.  Non-toxic appearance. He does not have a sickly appearance.  HENT:  Head: Normocephalic.  Right Ear: Hearing, external ear and ear canal normal. Tympanic membrane is injected.  Left Ear: Hearing, tympanic membrane, external ear and ear canal normal.  Nose: Mucosal edema and rhinorrhea present. Right sinus exhibits no maxillary sinus tenderness and no frontal sinus tenderness. Left sinus exhibits no maxillary sinus tenderness and no frontal sinus tenderness.  Mouth/Throat: Uvula is midline. Posterior oropharyngeal erythema present.  Neck: Normal range of motion. Neck supple.  Cardiovascular: Normal  rate, regular rhythm, normal heart sounds and normal pulses.  Pulmonary/Chest: Effort normal and breath sounds normal.  Abdominal: Soft. Bowel sounds are normal.  Musculoskeletal: Normal range of motion.  Lymphadenopathy:       Head (right side): No submental and no submandibular adenopathy present.       Head (left side): No submental and no submandibular adenopathy present.    He has no cervical adenopathy.  Neurological: He is alert and oriented to person, place, and time.  Psychiatric: He has a normal mood and affect.  Vitals reviewed.  A: 1. Upper respiratory tract infection, unspecified type   2. Cough   3. Nasal congestion   4. History of asthma   5. Nasal sore    P: Discussed exam findings, diagnosis etiology and medication use and indications reviewed with patient. Follow- Up and discharge instructions provided. No emergent/urgent issues found on exam.  Patient verbalized understanding of information provided and agrees with plan of care (POC), all questions answered.  PLAN< Discussed since patient feels that symptoms are becoming less severe. Discussed medications for symptomatic support and when to call back for treatment. Additional concern for nasal sore irritation- will trial bactroban ointment and condition monitoring.  1. Upper respiratory tract infection, unspecified type - benzonatate (TESSALON) 200 MG capsule; Take 1 capsule (200 mg total) by mouth 3 (three) times daily as needed for cough. Take with full glass of water. - azelastine (ASTELIN) 0.1 % nasal spray; Place 1 spray into both nostrils 2 (two) times daily. Use in each nostril as directed  2. Cough - benzonatate (TESSALON) 200 MG capsule; Take 1 capsule (200 mg total) by mouth 3 (three) times  daily as needed for cough. Take with full glass of water.  3. Nasal congestion - azelastine (ASTELIN) 0.1 % nasal spray; Place 1 spray into both nostrils 2 (two) times daily. Use in each nostril as directed  4.  History of asthma  5. Nasal sore - mupirocin nasal ointment (BACTROBAN NASAL) 2 %; Place 1 application into the nose 2 (two) times daily. Use one-half of tube in each nostril twice daily for five (5) days.

## 2018-10-02 ENCOUNTER — Other Ambulatory Visit: Payer: Self-pay | Admitting: Family Medicine

## 2018-10-02 DIAGNOSIS — J4520 Mild intermittent asthma, uncomplicated: Secondary | ICD-10-CM

## 2018-10-02 DIAGNOSIS — F429 Obsessive-compulsive disorder, unspecified: Secondary | ICD-10-CM

## 2018-10-02 NOTE — Telephone Encounter (Signed)
Copied from [place] [id number]. Topic: Quick Communication - Rx Refill/Question >> Oct 02, 2018  4:22 PM Alanda Slim E wrote: Medication: albuterol (PROVENTIL HFA;VENTOLIN HFA) 108 (90 Base) MCG/ACT inhaler  citalopram (CELEXA) 40 MG tablet   Has the patient contacted their pharmacy? Yes - change to [facility] and 90 day supply   Preferred Pharmacy (with phone number or street name): [id number] - [place], [place] [phone] (Phone) [phone] (Fax)    Agent: Please be advised that RX refills may take up to 3 business days. We ask that you follow-up with your pharmacy.

## 2018-10-03 MED ORDER — CITALOPRAM HYDROBROMIDE 40 MG PO TABS: 40.0000 mg | ORAL_TABLET | Freq: Every day | ORAL | 2 refills | Status: DC

## 2018-10-03 MED ORDER — ALBUTEROL SULFATE HFA 108 (90 BASE) MCG/ACT IN AERS: 2.0000 | INHALATION_SPRAY | Freq: Four times a day (QID) | RESPIRATORY_TRACT | 0 refills | Status: DC | PRN

## 2018-10-03 NOTE — Telephone Encounter (Signed)
Pt requesting change in pharmacy Requested Prescriptions  Pending Prescriptions Disp Refills  . albuterol (PROVENTIL HFA;VENTOLIN HFA) 108 (90 Base) MCG/ACT inhaler 3 Inhaler 0    Sig: Inhale 2 puffs into the lungs every 6 (six) hours as needed for wheezing or shortness of breath.     Pulmonology:  Beta Agonists Failed - 10/03/2018  7:17 AM      Failed - One inhaler should last at least one month. If the patient is requesting refills earlier, contact the patient to check for uncontrolled symptoms.      Passed - Valid encounter within last 12 months    Recent Outpatient Visits          8 months ago Routine general medical examination at a health care facility   [facility] [place], Malka So, MD   9 months ago Hyperlipidemia, unspecified hyperlipidemia type   [facility] [place], Malka So, MD           . citalopram (CELEXA) 40 MG tablet 90 tablet 2    Sig: Take 1 tablet (40 mg total) by mouth daily.     Psychiatry:  Antidepressants - SSRI Failed - 10/03/2018  7:17 AM      Failed - Valid encounter within last 6 months    Recent Outpatient Visits          8 months ago Routine general medical examination at a health care facility   [facility] [place], Malka So, MD   9 months ago Hyperlipidemia, unspecified hyperlipidemia type   [facility] [place], Malka So, MD

## 2018-10-03 NOTE — Telephone Encounter (Signed)
Copied from [place] [id number]. Topic: General - Other >> Oct 02, 2018  4:20 PM Alanda Slim E wrote: Reason for CRM: Pt has changed insurance([facility] benefits) and meds will need to go to [facility] for now on in 90 day supplys

## 2019-03-28 DIAGNOSIS — Z0189 Encounter for other specified special examinations: Secondary | ICD-10-CM | POA: Diagnosis not present

## 2019-04-16 ENCOUNTER — Ambulatory Visit (INDEPENDENT_AMBULATORY_CARE_PROVIDER_SITE_OTHER): Payer: BC Managed Care – PPO | Admitting: Allergy

## 2019-04-16 ENCOUNTER — Other Ambulatory Visit: Payer: Self-pay

## 2019-04-16 ENCOUNTER — Encounter: Payer: Self-pay | Admitting: Allergy

## 2019-04-16 VITALS — BP 126/66 | HR 60 | Temp 97.0°F | Resp 17 | Ht 75.5 in

## 2019-04-16 DIAGNOSIS — J310 Chronic rhinitis: Secondary | ICD-10-CM | POA: Insufficient documentation

## 2019-04-16 DIAGNOSIS — R0982 Postnasal drip: Secondary | ICD-10-CM | POA: Diagnosis not present

## 2019-04-16 DIAGNOSIS — J4540 Moderate persistent asthma, uncomplicated: Secondary | ICD-10-CM

## 2019-04-16 MED ORDER — BREO ELLIPTA 200-25 MCG/INH IN AEPB: 1.0000 | INHALATION_SPRAY | Freq: Every day | RESPIRATORY_TRACT | 5 refills | Status: DC

## 2019-04-16 MED ORDER — AZELASTINE-FLUTICASONE 137-50 MCG/ACT NA SUSP: 1.0000 | Freq: Two times a day (BID) | NASAL | 5 refills | Status: DC

## 2019-04-16 NOTE — Assessment & Plan Note (Signed)
Postnasal drip and throat clearing for 15 years.  No specific triggers noted.  Tried Nasonex inconsistently and Protonix with unknown benefit.  No recent ENT evaluation.  Skin testing over 10 years ago was negative per patient report.  Unable to skin test today due to time constraints and will do environmental allergy panel at next visit.  Start dymista 1 spray twice a day.  Let us know if it's not covered by your insurance.

## 2019-04-16 NOTE — Patient Instructions (Addendum)
Asthma: . Daily controller medication(s): start Breo 200 1 puff daily. Rinse mouth after each use. This replaces Symbicort for now. o Demonstrated proper use and sample given.  . Prior to physical activity: May use albuterol rescue inhaler 2 puffs 5 to 15 minutes prior to strenuous physical activities. Marland Kitchen Rescue medications: May use albuterol rescue inhaler 2 puffs or nebulizer every 4 to 6 hours as needed for shortness of breath, chest tightness, coughing, and wheezing. Monitor frequency of use.  . Asthma control goals:  o Full participation in all desired activities (may need albuterol before activity) o Albuterol use two times or less a week on average (not counting use with activity) o Cough interfering with sleep two times or less a month o Oral steroids no more than once a year o No hospitalizations  Rhinitis/throat clearing:  Start dymista 1 spray twice a day.  Let us know if it's not covered by your insurance.   Follow up in 4 weeks for skin testing and follow up. No allergy medications for 3 days before.

## 2019-04-16 NOTE — Progress Notes (Signed)
New Patient Note  RE: Antonio House MRN: [id number] DOB: 25-Sep-1970 Date of Office Visit: 04/16/2019  Referring provider: Martinique, Betty G, MD Primary care provider: Martinique, Betty G, MD  Chief Complaint: Asthma and Other (clearing throat)  History of Present Illness: I had the pleasure of seeing Antonio House for initial evaluation at the [facility] on 04/16/2019. He is a 48 y.o. male, who is self-referred here for the evaluation of asthma.   Asthma:  He reports symptoms of chest tightness, shortness of breath, coughing, wheezing, nocturnal awakenings for 40+ years but worsening the last few months. Current medications include Symbicort 160 2 puffs 1-2 times x few years and albuterol prn which help. He reports not using aerochamber with inhalers. He tried the following inhalers: Advair. Main triggers are unknown but exercise is one of them. In the last month, frequency of symptoms: daily. Frequency of nocturnal symptoms: twice a week. Frequency of SABA use: daily. Interference with physical activity: yes. Sleep is disturbed. In the last 12 months, emergency room visits/urgent care visits/doctor office visits or hospitalizations due to respiratory issues: 0. In the last 12 months, oral steroids courses: 0. Lifetime history of hospitalization for respiratory issues: no. Prior intubations: no. Asthma was diagnosed as a child. History of pneumonia: no. He was evaluated by allergist in the past. Smoking exposure: no. Up to date with flu vaccine: not yet.  History of reflux: yes and used to be on Protonix. ACT score 13.  Rhinitis:  He reports symptoms of throat clearing, PND. Symptoms have been going on for 15 years. The symptoms are present all year around. Other triggers include exposure to unknown. Anosmia: no. Headache: no. No additional trigger factors including exposure to strong odors(perfumes/cleaning agents), change in temperature, spicy food and emotional  situations. He has used nasonex with unknown benefit. Sinus infections: no. Previous work up includes: over 10 years ago negative skin prick testing. Previous ENT evaluation: not recently. Previous sinus imaging: no. History of nasal polyps: no.  Assessment and Plan: Antonio House is a 48 y.o. male with: Not well controlled moderate persistent asthma Diagnosed with asthma over 40 years ago however worsening symptoms the last few months.  Currently using Symbicort 160 2 puffs once a day and albuterol as needed with good benefit.  Tried Advair in the past.  Main triggers are unknown but exertion makes things worse.  Used to follow with allergist in the past.  Today's ACT score is 13.  Today's spirometry showed mild obstruction with 8% improvement in FEV1 post bronchodilator treatment.  Clinically also feeling better.  Patient was unable to undergo skin testing today due to time constraints.  Skin prick test to environmental panel at next visit to identify any environmental triggers. . Daily controller medication(s): start Breo 200 1 puff daily. Rinse mouth after each use. This replaces Symbicort for now. o Demonstrated proper use and sample given.  . Prior to physical activity: May use albuterol rescue inhaler 2 puffs 5 to 15 minutes prior to strenuous physical activities. Marland Kitchen Rescue medications: May use albuterol rescue inhaler 2 puffs or nebulizer every 4 to 6 hours as needed for shortness of breath, chest tightness, coughing, and wheezing. Monitor frequency of use.  . Repeat spirometry at next visit.  Post-nasal drip Postnasal drip and throat clearing for 15 years.  No specific triggers noted.  Tried Nasonex inconsistently and Protonix with unknown benefit.  No recent ENT evaluation.  Skin testing over 10 years ago was negative  per patient report.  Unable to skin test today due to time constraints and will do environmental allergy panel at next visit.  Start dymista 1 spray twice a day.  Let us  know if it's not covered by your insurance.   Return in about 4 weeks (around 05/14/2019) for Skin testing.  Meds ordered this encounter  Medications  . fluticasone furoate-vilanterol (BREO ELLIPTA) 200-25 MCG/INH AEPB    Sig: Inhale 1 puff into the lungs daily.    Dispense:  1 each    Refill:  5  . Azelastine-Fluticasone 137-50 MCG/ACT SUSP    Sig: Place 1 spray into the nose 2 (two) times daily.    Dispense:  23 g    Refill:  5    May use generic or branded. Whichever is more cost effective. Use this coupon code for branded: [id number]; [id number];  RxGRP:[id number]; Issuer:[id number]; BY:[id number]   Other allergy screening: Asthma: yes Rhino conjunctivitis: yes Food allergy: no Medication allergy: yes  Prozac - hives Hymenoptera allergy: no Urticaria: no Eczema:no History of recurrent infections suggestive of immunodeficency: no  Diagnostics: Spirometry:  Tracings reviewed. His effort: Good reproducible efforts. FVC: 5.57L FEV1: 3.41L, 85% predicted FEV1/FVC ratio: 61% Interpretation: Spirometry consistent with mild obstructive disease and 8% improvement in FEV1 post bronchodilator treatment.  Please see scanned spirometry results for details.  Skin Testing: deferred due to time constraints.  Past Medical History: Patient Active Problem List   Diagnosis Date Noted  . Not well controlled moderate persistent asthma 04/16/2019  . Post-nasal drip 04/16/2019  . Mild intermittent asthma without complication [id number]  . Obsessive-compulsive disorder 12/22/2017  . Hyperlipidemia 12/17/2017  . Migraine headache with aura 12/17/2017   Past Medical History:  Diagnosis Date  . Asthma   . Hx of migraines   . Hyperlipidemia    Past Surgical History: Past Surgical History:  Procedure Laterality Date  . WISDOM TOOTH EXTRACTION     Medication List:  Current Outpatient Medications  Medication Sig Dispense Refill  . albuterol (PROVENTIL HFA;VENTOLIN HFA) 108 (90 Base)  MCG/ACT inhaler Inhale 2 puffs into the lungs every 6 (six) hours as needed for wheezing or shortness of breath. 3 Inhaler 0  . citalopram (CELEXA) 40 MG tablet Take 1 tablet (40 mg total) by mouth daily. 90 tablet 2  . ketoconazole (NIZORAL) 2 % shampoo APPLY TO SCALP EVERY DAY AS DIRECTED    . Azelastine-Fluticasone 137-50 MCG/ACT SUSP Place 1 spray into the nose 2 (two) times daily. 23 g 5  . fluticasone furoate-vilanterol (BREO ELLIPTA) 200-25 MCG/INH AEPB Inhale 1 puff into the lungs daily. 1 each 5  . ketoconazole (NIZORAL) 2 % cream Apply 1 application topically daily. (Patient not taking: Reported on 01/31/2018) 15 g 6  . mupirocin nasal ointment (BACTROBAN NASAL) 2 % Place 1 application into the nose 2 (two) times daily. Use one-half of tube in each nostril twice daily for five (5) days. (Patient not taking: Reported on 04/16/2019) 10 g 0   No current facility-administered medications for this visit.    Allergies: Allergies  Allergen Reactions  . Prozac [Fluoxetine Hcl] Hives   Social History: Social History   Socioeconomic History  . Marital status: Married    Spouse name: Not on file  . Number of children: Not on file  . Years of education: Not on file  . Highest education level: Not on file  Occupational History  . Not on file  Social Needs  . Financial resource strain: Not on file  .  Food insecurity    Worry: Not on file    Inability: Not on file  . Transportation needs    Medical: Not on file    Non-medical: Not on file  Tobacco Use  . Smoking status: Never Smoker  . Smokeless tobacco: Never Used  Substance and Sexual Activity  . Alcohol use: Yes  . Drug use: Never  . Sexual activity: Yes    Partners: Female  Lifestyle  . Physical activity    Days per week: Not on file    Minutes per session: Not on file  . Stress: Not on file  Relationships  . Social Herbalist on phone: Not on file    Gets together: Not on file    Attends religious service:  Not on file    Active member of club or organization: Not on file    Attends meetings of clubs or organizations: Not on file    Relationship status: Not on file  Other Topics Concern  . Not on file  Social History Narrative  . Not on file   Lives in a 48 year old home. Smoking: denies Occupation: Counselling psychologist HistoryFreight forwarder in the house: no Charity fundraiser in the family room: no Carpet in the bedroom: no Heating: gas Cooling: central Pet: yes 4 cats x 12 yrs  Family History: Family History  Problem Relation Age of Onset  . Cancer Mother   . Early death Mother   . Hypertension Mother   . COPD Father   . Heart disease Father   . Stroke Maternal Grandmother   . Alcohol abuse Maternal Grandfather   . Birth defects Maternal Grandfather   . Early death Maternal Grandfather   . Heart disease Paternal Grandmother   . Stroke Paternal Grandfather    Review of Systems  Constitutional: Negative for appetite change, chills, fever and unexpected weight change.  HENT: Positive for postnasal drip. Negative for congestion and rhinorrhea.   Eyes: Negative for itching.  Respiratory: Positive for cough, chest tightness, shortness of breath and wheezing.   Cardiovascular: Negative for chest pain.  Gastrointestinal: Negative for abdominal pain.  Genitourinary: Negative for difficulty urinating.  Skin: Negative for rash.  Allergic/Immunologic: Negative for food allergies.  Neurological: Negative for headaches.   Objective: BP 126/66 (BP Location: Right Arm, Patient Position: Sitting, Cuff Size: Normal)   Pulse 60   Temp (!) 97 F (36.1 C) (Temporal)   Resp 17   Ht 6' 3.5" (1.918 m)   SpO2 95%   BMI 29.13 kg/m  Body mass index is 29.13 kg/m. Physical Exam  Constitutional: He is oriented to person, place, and time. He appears well-developed and well-nourished.  HENT:  Head: Normocephalic and atraumatic.  Right Ear: External ear normal.  Left Ear: External ear  normal.  Nose: Nose normal.  Mouth/Throat: Oropharynx is clear and moist.  Eyes: Conjunctivae and EOM are normal.  Neck: Neck supple.  Cardiovascular: Normal rate, regular rhythm and normal heart sounds. Exam reveals no gallop and no friction rub.  No murmur heard. Pulmonary/Chest: Effort normal and breath sounds normal. He has no wheezes. He has no rales.  Abdominal: Soft.  Neurological: He is alert and oriented to person, place, and time.  Skin: Skin is warm. No rash noted.  Psychiatric: He has a normal mood and affect. His behavior is normal.  Nursing note and vitals reviewed.  The plan was reviewed with the patient/family, and all questions/concerned were addressed.  It was my  pleasure to see Antonio House today and participate in his care. Please feel free to contact me with any questions or concerns.  Sincerely,  Rexene Alberts, DO Allergy & Immunology  [facility] office: [phone] [facility] office: [place] office: [phone]

## 2019-04-16 NOTE — Assessment & Plan Note (Signed)
>>  ASSESSMENT AND PLAN FOR POST-NASAL DRIP WRITTEN ON 04/16/2019  3:11 PM BY Rexene Alberts M, DO  Postnasal drip and throat clearing for 15 years.  No specific triggers noted.  Tried Nasonex inconsistently and Protonix with unknown benefit.  No recent ENT evaluation.  Skin testing over 10 years ago was negative per patient report.  Unable to skin test today due to time constraints and will do environmental allergy panel at next visit.  Start dymista 1 spray twice a day.  Let us know if it's not covered by your insurance.

## 2019-04-16 NOTE — Assessment & Plan Note (Addendum)
Diagnosed with asthma over 40 years ago however worsening symptoms the last few months.  Currently using Symbicort 160 2 puffs once a day and albuterol as needed with good benefit.  Tried Advair in the past.  Main triggers are unknown but exertion makes things worse.  Used to follow with allergist in the past.  Today's ACT score is 13.  Today's spirometry showed mild obstruction with 8% improvement in FEV1 post bronchodilator treatment.  Clinically also feeling better.  Patient was unable to undergo skin testing today due to time constraints.  Skin prick test to environmental panel at next visit to identify any environmental triggers. . Daily controller medication(s): start Breo 200 1 puff daily. Rinse mouth after each use. This replaces Symbicort for now. o Demonstrated proper use and sample given.  . Prior to physical activity: May use albuterol rescue inhaler 2 puffs 5 to 15 minutes prior to strenuous physical activities. Marland Kitchen Rescue medications: May use albuterol rescue inhaler 2 puffs or nebulizer every 4 to 6 hours as needed for shortness of breath, chest tightness, coughing, and wheezing. Monitor frequency of use.  . Repeat spirometry at next visit.

## 2019-05-28 DIAGNOSIS — Z23 Encounter for immunization: Secondary | ICD-10-CM | POA: Diagnosis not present

## 2019-10-11 ENCOUNTER — Other Ambulatory Visit: Payer: Self-pay | Admitting: Family Medicine

## 2019-10-11 DIAGNOSIS — F429 Obsessive-compulsive disorder, unspecified: Secondary | ICD-10-CM

## 2019-10-23 ENCOUNTER — Encounter: Payer: Self-pay | Admitting: Family Medicine

## 2019-10-23 ENCOUNTER — Telehealth (INDEPENDENT_AMBULATORY_CARE_PROVIDER_SITE_OTHER): Payer: BC Managed Care – PPO | Admitting: Family Medicine

## 2019-10-23 DIAGNOSIS — E785 Hyperlipidemia, unspecified: Secondary | ICD-10-CM

## 2019-10-23 DIAGNOSIS — F429 Obsessive-compulsive disorder, unspecified: Secondary | ICD-10-CM

## 2019-10-23 DIAGNOSIS — J4520 Mild intermittent asthma, uncomplicated: Secondary | ICD-10-CM

## 2019-10-23 MED ORDER — ALBUTEROL SULFATE HFA 108 (90 BASE) MCG/ACT IN AERS: 2.0000 | INHALATION_SPRAY | Freq: Four times a day (QID) | RESPIRATORY_TRACT | 3 refills | Status: DC | PRN

## 2019-10-23 MED ORDER — BREO ELLIPTA 200-25 MCG/INH IN AEPB: 1.0000 | INHALATION_SPRAY | Freq: Every day | RESPIRATORY_TRACT | 4 refills | Status: DC

## 2019-10-23 MED ORDER — CITALOPRAM HYDROBROMIDE 40 MG PO TABS: 40.0000 mg | ORAL_TABLET | Freq: Every day | ORAL | 3 refills | Status: DC

## 2019-10-23 NOTE — Progress Notes (Signed)
Virtual Visit via Video Note   I connected with Antonio House on 10/23/19 by a video enabled telemedicine application and verified that I am speaking with the correct person using two identifiers.  Location patient: home Location provider:work office Persons participating in the virtual visit: patient, provider  I discussed the limitations of evaluation and management by telemedicine and the availability of in person appointments. The patient expressed understanding and agreed to proceed.   HPI: Antonio Tzintzun is a 49 yo male with hx of OCD,HLD,and allergies being seen today for chronic disease management. He was last seen on 01/15/2018.  OCD: He is on Celexa 40 mg daily, which is still helping with symptoms. No depressed mood or suicidal thoughts.  HLD: He is on non pharmacologic treatment. He started running a few times per week. Not consistent with following a healthful diet.  Negative for severe/frequent headache, visual changes,claudication, focal weakness, or edema.  Lab Results  Component Value Date   CHOL 207 (H) 12/17/2017   HDL 43.40 12/17/2017   LDLCALC 138 (H) 12/17/2017   TRIG 128.0 12/17/2017   CHOLHDL 5 12/17/2017   Asthma: He follows with immunologist, Dr Maudie Mercury. He needs refills of inhalers. He is on Breo Ellipta 1 puff daily. Albuterol inh prn, usually before exercising.  Negative for cough,wheezing,SOB,or CP.  ROS: See pertinent positives and negatives per HPI.  Past Medical History:  Diagnosis Date  . Asthma   . Hx of migraines   . Hyperlipidemia     Past Surgical History:  Procedure Laterality Date  . WISDOM TOOTH EXTRACTION      Family History  Problem Relation Age of Onset  . Cancer Mother   . Early death Mother   . Hypertension Mother   . COPD Father   . Heart disease Father   . Stroke Maternal Grandmother   . Alcohol abuse Maternal Grandfather   . Birth defects Maternal Grandfather   . Early death Maternal Grandfather   . Heart disease  Paternal Grandmother   . Stroke Paternal Grandfather     Social History   Socioeconomic History  . Marital status: Married    Spouse name: Not on file  . Number of children: Not on file  . Years of education: Not on file  . Highest education level: Not on file  Occupational History  . Not on file  Tobacco Use  . Smoking status: Never Smoker  . Smokeless tobacco: Never Used  Substance and Sexual Activity  . Alcohol use: Yes  . Drug use: Never  . Sexual activity: Yes    Partners: Female  Other Topics Concern  . Not on file  Social History Narrative  . Not on file   Social Determinants of Health   Financial Resource Strain:   . Difficulty of Paying Living Expenses:   Food Insecurity:   . Worried About Charity fundraiser in the Last Year:   . Arboriculturist in the Last Year:   Transportation Needs:   . Film/video editor (Medical):   Marland Kitchen Lack of Transportation (Non-Medical):   Physical Activity:   . Days of Exercise per Week:   . Minutes of Exercise per Session:   Stress:   . Feeling of Stress :   Social Connections:   . Frequency of Communication with Friends and Family:   . Frequency of Social Gatherings with Friends and Family:   . Attends Religious Services:   . Active Member of Clubs or Organizations:   .  Attends Archivist Meetings:   Marland Kitchen Marital Status:   Intimate Partner Violence:   . Fear of Current or Ex-Partner:   . Emotionally Abused:   Marland Kitchen Physically Abused:   . Sexually Abused:     Current Outpatient Medications:  .  albuterol (VENTOLIN HFA) 108 (90 Base) MCG/ACT inhaler, Inhale 2 puffs into the lungs every 6 (six) hours as needed for wheezing or shortness of breath., Disp: 18 g, Rfl: 3 .  Azelastine-Fluticasone 137-50 MCG/ACT SUSP, Place 1 spray into the nose 2 (two) times daily., Disp: 23 g, Rfl: 5 .  citalopram (CELEXA) 40 MG tablet, Take 1 tablet (40 mg total) by mouth daily., Disp: 90 tablet, Rfl: 3 .  fluticasone furoate-vilanterol  (BREO ELLIPTA) 200-25 MCG/INH AEPB, Inhale 1 puff into the lungs daily., Disp: 60 each, Rfl: 4 .  ketoconazole (NIZORAL) 2 % shampoo, APPLY TO SCALP EVERY DAY AS DIRECTED, Disp: , Rfl:   EXAM:  VITALS per patient if applicable:  GENERAL: alert, oriented, appears well and in no acute distress  HEENT: atraumatic, conjuntiva clear, no obvious abnormalities on inspection.  NECK: normal movements of the head and neck  LUNGS: on inspection no signs of respiratory distress, breathing rate appears normal, no obvious gross SOB, gasping or wheezing  CV: no obvious cyanosis  PSYCH/NEURO: pleasant and cooperative, no obvious depression or anxiety, speech and thought processing grossly intact  ASSESSMENT AND PLAN:  Discussed the following assessment and plan: Orders Placed This Encounter  Procedures  . Comprehensive metabolic panel  . Lipid panel    Mild intermittent asthma without complication - Plan: albuterol (VENTOLIN HFA) 108 (90 Base) MCG/ACT inhaler Problem is well controlled. No changes in current management. Continue following annually,before if needed.  Hyperlipidemia, unspecified hyperlipidemia type - Plan: Comprehensive metabolic panel, Lipid panel Low fat diet and regular physical activity for now. He will arrange lab appt and further recommendations will be given according to [facility] results.  Obsessive-compulsive disorder, unspecified type - Plan: citalopram (CELEXA) 40 MG tablet Problem is well controlled. Continue Celexa 40 mg daily. F/U annually as far as symptoms continue well controlled.    I discussed the assessment and treatment plan with the patient. TMr Bartnik was provided an opportunity to ask questions and all were answered. He agreed with the plan and demonstrated an understanding of the instructions.    Return in about 1 year (around 10/22/2020) for cpe. He will call to arrange lab appt.   Gorgeous Newlun Martinique, MD

## 2019-11-10 ENCOUNTER — Other Ambulatory Visit: Payer: Self-pay | Admitting: Family Medicine

## 2019-11-10 MED ORDER — FLUTICASONE-SALMETEROL 250-50 MCG/DOSE IN AEPB: 1.0000 | INHALATION_SPRAY | Freq: Two times a day (BID) | RESPIRATORY_TRACT | 3 refills | Status: DC

## 2020-01-13 ENCOUNTER — Encounter: Payer: Self-pay | Admitting: Family Medicine

## 2020-01-18 ENCOUNTER — Other Ambulatory Visit: Payer: Self-pay | Admitting: Family Medicine

## 2020-01-18 DIAGNOSIS — J4520 Mild intermittent asthma, uncomplicated: Secondary | ICD-10-CM

## 2020-01-18 MED ORDER — BREO ELLIPTA 100-25 MCG/INH IN AEPB: 1.0000 | INHALATION_SPRAY | Freq: Every day | RESPIRATORY_TRACT | 3 refills | Status: DC

## 2020-09-08 ENCOUNTER — Telehealth: Payer: BC Managed Care – PPO | Admitting: Family Medicine

## 2020-09-08 DIAGNOSIS — U071 COVID-19: Secondary | ICD-10-CM

## 2020-09-08 MED ORDER — BENZONATATE 100 MG PO CAPS
100.0000 mg | ORAL_CAPSULE | Freq: Three times a day (TID) | ORAL | 0 refills | Status: DC | PRN
Start: 2020-09-08 — End: 2021-02-09

## 2020-09-08 MED ORDER — PREDNISONE 20 MG PO TABS
40.0000 mg | ORAL_TABLET | Freq: Every day | ORAL | 0 refills | Status: DC
Start: 2020-09-08 — End: 2021-02-09

## 2020-09-08 NOTE — Progress Notes (Signed)
Virtual Visit via Video Note  I connected with "Antonio House"  on 09/08/20 at  1:20 PM EST by a video enabled telemedicine application and verified that I am speaking with the correct person using two identifiers.  Location patient: home, [place] Location provider:work or home office Persons participating in the virtual visit: patient, provider  I discussed the limitations of evaluation and management by telemedicine and the availability of in person appointments. The patient expressed understanding and agreed to proceed.   HPI:  Acute telemedicine visit for : -Onset: 08/05/20 -tested positive for covid on monday -Symptoms include: congestion, sore throat, body aches initially, using alb at night prophylactically - but not having asthma symptoms -Denies: cp - other than occ with cough, SOB, NVD, malaise -Pertinent past medical history: obesity or overweight, asthma - on breo for asthma, waiting on prior auth through PCP office -Pertinent medication allergies: prozac -COVID-19 vaccine status: had covid vaccines x2 + booster (pfizer)  ROS: See pertinent positives and negatives per HPI.  Past Medical History:  Diagnosis Date  . Asthma   . Hx of migraines   . Hyperlipidemia     Past Surgical History:  Procedure Laterality Date  . WISDOM TOOTH EXTRACTION       Current Outpatient Medications:  .  benzonatate (TESSALON PERLES) 100 MG capsule, Take 1 capsule (100 mg total) by mouth 3 (three) times daily as needed., Disp: 20 capsule, Rfl: 0 .  predniSONE (DELTASONE) 20 MG tablet, Take 2 tablets (40 mg total) by mouth daily with breakfast., Disp: 8 tablet, Rfl: 0 .  albuterol (VENTOLIN HFA) 108 (90 Base) MCG/ACT inhaler, Inhale 2 puffs into the lungs every 6 (six) hours as needed for wheezing or shortness of breath., Disp: 18 g, Rfl: 3 .  Azelastine-Fluticasone 137-50 MCG/ACT SUSP, Place 1 spray into the nose 2 (two) times daily., Disp: 23 g, Rfl: 5 .  citalopram (CELEXA) 40 MG tablet, Take 1 tablet  (40 mg total) by mouth daily., Disp: 90 tablet, Rfl: 3 .  fluticasone furoate-vilanterol (BREO ELLIPTA) 100-25 MCG/INH AEPB, Inhale 1 puff into the lungs daily., Disp: 60 each, Rfl: 3 .  ketoconazole (NIZORAL) 2 % shampoo, APPLY TO SCALP EVERY DAY AS DIRECTED, Disp: , Rfl:   EXAM:  VITALS per patient if applicable:  GENERAL: alert, oriented, appears well and in no acute distress  HEENT: atraumatic, conjunttiva clear, no obvious abnormalities on inspection of external nose and ears  NECK: normal movements of the head and neck  LUNGS: on inspection no signs of respiratory distress, breathing rate appears normal, no obvious gross SOB, gasping or wheezing  CV: no obvious cyanosis  MS: moves all visible extremities without noticeable abnormality  PSYCH/NEURO: pleasant and cooperative, no obvious depression or anxiety, speech and thought processing grossly intact  ASSESSMENT AND PLAN:  Discussed the following assessment and plan:  COVID-19 - Plan: Ambulatory referral for Covid Treatment  -we discussed possible serious and likely etiologies, options for evaluation and workup, limitations of telemedicine visit vs in person visit, treatment, treatment risks and precautions. Pt prefers to treat via telemedicine empirically rather than in person at this moment.  Discussed treatment options, ideal treatment window, potential complications, isolation and precautions for COVID-19.  They opted for referral for treatment for Covid, did discuss current shortage of treatment doses. They did want a prescription for cough, Tessalon Rx sent.  Also, wife whom is a provider requested prednisone for the asthma in case worsening and given he is off of his Breo.  Rx  sent.  Also sent urgent message to PCP about his prescription being out and requiring a prior authorization.  She reports he has plenty of albuterol he reports he has not required it, other than he is taking it at night just in case, but not for  symptoms.  Other symptomatic care measures summarized in patient instructions.  Work/School slipped offered: declined Scheduled follow up with PCP offered: They agreed to schedule follow-up if needed. Advised to schedule follow-up video visit through PCP office or seek prompt in person care if worsening, new symptoms arise, or if is not improving with treatment. Discussed options for inperson care if PCP office not available. Did let this patient know that I only do telemedicine on Tuesdays and Thursdays for [place]. Advised to schedule follow up visit with PCP or [facility] if any further questions or concerns to avoid delays in care.   I discussed the assessment and treatment plan with the patient. The patient was provided an opportunity to ask questions and all were answered. The patient agreed with the plan and demonstrated an understanding of the instructions.     Lucretia Kern, DO

## 2020-09-08 NOTE — Patient Instructions (Signed)
  HOME CARE TIPS:  -I sent the medication(s) we discussed to your pharmacy: Meds ordered this encounter  Medications  . benzonatate (TESSALON PERLES) 100 MG capsule    Sig: Take 1 capsule (100 mg total) by mouth 3 (three) times daily as needed.    Dispense:  20 capsule    Refill:  0  . predniSONE (DELTASONE) 20 MG tablet    Sig: Take 2 tablets (40 mg total) by mouth daily with breakfast.    Dispense:  8 tablet    Refill:  0 To use if worsening or asthma symptoms not responding to your albuterol.  Please also follow-up with your primary care doctor.    -Use your albuterol as needed per instructions  -I sent a message to the outpatient Covid treatment center letting them know that you are interested in treatment. Currently there are very limited treatment doses available and the are calling the folks that are at the highest risk of hospitalization or severe disease first.   -can use tylenol or aleve if needed for fevers, aches and pains per instructions  -can use nasal saline a few times per day if you have nasal congestion  -stay hydrated, drink plenty of fluids and eat small healthy meals - avoid dairy  -can take 1000 IU (47mcg) Vit D3 and 100-500 mg of Vit C daily per instructions  -If the Covid test is positive, check out the CDC website for more information on home care, transmission and treatment for COVID19  -follow up with your doctor in 2-3 days unless improving and feeling better  -stay home while sick, except to seek medical care, and if you have [place] ideally it would be best to stay home for a full 10 days since the onset of symptoms PLUS one day of no fever and feeling better. Wear a good mask (such as N95 or KN95) if around others to reduce the risk of transmission.  It was nice to meet you today, and I really hope you are feeling better soon. I help [facility] out with telemedicine visits on Tuesdays and Thursdays and am available for visits on those days. If you have  any concerns or questions following this visit please schedule a follow up visit with your Primary Care doctor or seek care at a local urgent care clinic to avoid delays in care.    Seek in person care or schedule a follow up video visit promptly if your symptoms worsen, new concerns arise or you are not improving with treatment. Call 911 and/or seek emergency care if your symptoms are severe or life threatening.

## 2020-09-09 ENCOUNTER — Telehealth: Payer: Self-pay | Admitting: Family

## 2020-09-09 ENCOUNTER — Other Ambulatory Visit: Payer: Self-pay | Admitting: Family Medicine

## 2020-09-09 DIAGNOSIS — J4520 Mild intermittent asthma, uncomplicated: Secondary | ICD-10-CM

## 2020-09-09 MED ORDER — BREO ELLIPTA 100-25 MCG/INH IN AEPB: 1.0000 | INHALATION_SPRAY | Freq: Every day | RESPIRATORY_TRACT | 0 refills | Status: DC

## 2020-09-09 NOTE — Telephone Encounter (Signed)
Called to discuss with patient about COVID-19 symptoms and the use of one of the available treatments for those with mild to moderate Covid symptoms and at a high risk of hospitalization.  Pt appears to qualify for outpatient treatment due to co-morbid conditions and/or a member of an at-risk group in accordance with the FDA Emergency Use Authorization.    Symptom onset: 09/05/20 Vaccinated: Yes Booster? Yes Immunocompromised? No Qualifiers: BMI >25   I called to speak with Antonio House and was unable to reach him. Voice mailbox was full and unable to leave a message. MyChart message will be sent. Appears to be a candidate for Molnupirivir if he can get it today.   Terri Piedra, NP 09/09/2020 12:32 PM

## 2020-12-06 ENCOUNTER — Other Ambulatory Visit: Payer: Self-pay | Admitting: Family Medicine

## 2020-12-06 DIAGNOSIS — F429 Obsessive-compulsive disorder, unspecified: Secondary | ICD-10-CM

## 2021-01-22 ENCOUNTER — Telehealth: Payer: BC Managed Care – PPO | Admitting: Emergency Medicine

## 2021-01-22 DIAGNOSIS — Z760 Encounter for issue of repeat prescription: Secondary | ICD-10-CM | POA: Diagnosis not present

## 2021-01-22 DIAGNOSIS — J4520 Mild intermittent asthma, uncomplicated: Secondary | ICD-10-CM | POA: Diagnosis not present

## 2021-01-22 MED ORDER — ALBUTEROL SULFATE HFA 108 (90 BASE) MCG/ACT IN AERS
2.0000 | INHALATION_SPRAY | Freq: Four times a day (QID) | RESPIRATORY_TRACT | 0 refills | Status: DC | PRN
Start: 2021-01-22 — End: 2021-02-09

## 2021-01-22 NOTE — Progress Notes (Signed)
Visit for Asthma  Based on what you have shared with me, it looks like you may have a flare up of your asthma.  Asthma is a chronic (ongoing) lung disease which results in airway obstruction, inflammation and hyper-responsiveness.   Asthma symptoms vary from person to person, with common symptoms including nighttime awakening and decreased ability to participate in normal activities as a result of shortness of breath. It is often triggered by changes in weather, changes in the season, changes in air temperature, or inside (home, school, daycare or work) allergens such as animal dander, mold, mildew, woodstoves or cockroaches.   It can also be triggered by hormonal changes, extreme emotion, physical exertion or an upper respiratory tract illness.     It is important to identify the trigger, and then eliminate or avoid the trigger if possible.   If you have been prescribed medications to be taken on a regular basis, it is important to follow the asthma action plan and to follow guidelines to adjust medication in response to increasing symptoms of decreased peak expiratory flow rate  Treatment: I have prescribed: Albuterol (Proventil HFA; Ventolin HFA) 108 (90 Base) MCG/ACT Inhaler 2 puffs into the lungs every six hours as needed for wheezing or shortness of breath  HOME CARE Only take medications as instructed by your medical team. Consider wearing a mask or scarf to improve breathing air temperature have been shown to decrease irritation and decrease exacerbations Get rest. Taking a steamy shower or using a humidifier may help nasal congestion sand ease sore throat pain. You can place a towel over your head and breathe in the steam from hot water coming from a faucet. Using a saline nasal spray works much the same way.  Cough drops, hare candies and sore throat lozenges may ease your  cough.  Avoid close contacts especially the very you and the elderly Cover your mouth if you cough or sneeze Always remember to wash your hands.    GET HELP RIGHT AWAY IF: You develop worsening symptoms; breathlessness at rest, drowsy, confused or agitated, unable to speak in full sentences You have coughing fits You develop a severe headache or visual changes You develop shortness of breath, difficulty breathing or start having chest pain Your symptoms persist after you have completed your treatment plan If your symptoms do not improve within 10 days  MAKE SURE YOU Understand these instructions. Will watch your condition. Will get help right away if you are not doing well or get worse.   Your e-visit answers were reviewed by a board certified advanced clinical practitioner to complete your personal care plan, Depending upon the condition, your plan could have included both over the counter or prescription medications.   Please review your pharmacy choice. Your safety is important to [place]. If you have drug allergies check your prescription carefully.  You can use MyChart to ask questions about today's visit, request a non-urgent  call back, or ask for a work or school excuse for 24 hours related to this e-Visit. If it has been greater than 24 hours you will need to follow up with your provider, or enter a new e-Visit to address those concerns.   You will get an e-mail in the next two days asking about your experience. I hope that your e-visit has been valuable and will speed your recovery. Thank you for using e-visits.   Approximately 5 minutes was spent documenting and reviewing patient's chart.

## 2021-02-08 NOTE — Progress Notes (Signed)
Follow Up Note  RE: Antonio House MRN: [id number] DOB: [birth year] Date of Office Visit: 02/09/2021  Referring provider: Martinique, Betty G, MD Primary care provider: Martinique, Betty G, MD  Chief Complaint: Asthma (Flare up)  History of Present Illness: I had the pleasure of seeing Antonio House for a follow up visit at the [facility] on 02/09/2021. He is a 50 y.o. male, who is being followed for asthma and PND. His previous allergy office visit was on 04/16/2019 with Dr. Maudie Mercury. Today is a regular follow up visit. Failed to follow up as recommended.  Asthma: Patient does not recall when his Memory Dance was switched from 227mcg to 19mcg. He doesn't remember if his symptoms were better on 200 or 100.  He ran out of his Memory Dance about 1 month ago and noted worsening chest tightness, throat clearing, wheezing since then.   Using albuterol mainly with exertion with good benefit. He did have to use it today around noon for the chest tightness. Denies any cardiac issues.   Denies any ER/urgent care visits. He did have Covid in February and looks like he was prescribed prednisone - he can't recall if he took it or not.    Post-nasal drip Patient has not used any nasal sprays recently but has been having PND. Denies any reflux/heartburn symptoms. Not taking any daily histamines.   Assessment and Plan: Dequane is a 50 y.o. male with: Not well controlled moderate persistent asthma Past history - Diagnosed with asthma over 40 years ago. Tried Advair and Symbicort.  Main triggers are unknown but exertion makes things worse.  2020 spirometry showed mild obstruction with 8% improvement in FEV1 post bronchodilator treatment.  Clinically also feeling better. Interim history - patient ran out of Breo 1 month ago and having worsening symptoms. He is not sure why the dose of Breo was changed from 242mcg to 168mcg. Denies reflux/heartburn/cardiac issues. Today's spirometry showed: moderate  obstructive disease with 8% and greater than 200cc improvement in FEV1 post bronchodilator treatment. Clinically feeling slightly improved. Daily controller medication(s): start Breo 225mcg 1 puff daily. Rinse mouth after each use.  Prior to physical activity: May use albuterol rescue inhaler 2 puffs 5 to 15 minutes prior to strenuous physical activities. Rescue medications: May use albuterol rescue inhaler 2 puffs or nebulizer every 4 to 6 hours as needed for shortness of breath, chest tightness, coughing, and wheezing. Monitor frequency of use.  Get spirometry at next visit. If chest tightness persists recommend following up with PCP as well.   Post-nasal drip Past history - Postnasal drip and throat clearing for 15 years.  No specific triggers noted.  Tried Nasonex inconsistently and Protonix with unknown benefit.  No recent ENT evaluation.  Skin testing over 10 years ago was negative per patient report. Interim history - unchanged. Not taking any medications and not sure if he ever used the dymista. Start dymista (fluticasone + azelastine nasal spray combination) 1 spray per nostril twice a Antonio. If it's not covered let us know.  Recommend environmental allergy testing in the future once asthma is better controlled.   Return in about 4 months (around 06/12/2021).  Meds ordered this encounter  Medications   DISCONTD: fluticasone furoate-vilanterol (BREO ELLIPTA) 200-25 MCG/INH AEPB    Sig: Inhale 1 puff into the lungs daily. Rinse mouth after each use.    Dispense:  60 each    Refill:  5   DISCONTD: albuterol (VENTOLIN HFA) 108 (90 Base) MCG/ACT inhaler  Sig: Inhale 2 puffs into the lungs every 4 (four) hours as needed for wheezing or shortness of breath (cough).    Dispense:  18 g    Refill:  1   DISCONTD: Azelastine-Fluticasone 137-50 MCG/ACT SUSP    Sig: Place 1 spray into the nose in the morning and at bedtime.    Dispense:  23 g    Refill:  5   albuterol (VENTOLIN HFA) 108 (90  Base) MCG/ACT inhaler    Sig: Inhale 2 puffs into the lungs every 4 (four) hours as needed for wheezing or shortness of breath (cough).    Dispense:  18 g    Refill:  1   Azelastine-Fluticasone 137-50 MCG/ACT SUSP    Sig: Place 1 spray into the nose in the morning and at bedtime.    Dispense:  23 g    Refill:  5   fluticasone furoate-vilanterol (BREO ELLIPTA) 200-25 MCG/INH AEPB    Sig: Inhale 1 puff into the lungs daily. Rinse mouth after each use.    Dispense:  60 each    Refill:  5    Lab Orders  No laboratory test(s) ordered today    Diagnostics: Spirometry:  Tracings reviewed. His effort: Good reproducible efforts. FVC: 5.73L FEV1: 3.43L, 74% predicted FEV1/FVC ratio: 60% Interpretation: Spirometry consistent with moderate obstructive disease with 8% and greater than 200cc improvement in FEV1 post bronchodilator treatment. Clinically feeling slightly improved.  Please see scanned spirometry results for details.  Medication List:  Current Outpatient Medications  Medication Sig Dispense Refill   citalopram (CELEXA) 40 MG tablet TAKE 1 TABLET(40 MG) BY MOUTH DAILY 30 tablet 0   ketoconazole (NIZORAL) 2 % shampoo APPLY TO SCALP EVERY Antonio AS DIRECTED     albuterol (VENTOLIN HFA) 108 (90 Base) MCG/ACT inhaler Inhale 2 puffs into the lungs every 4 (four) hours as needed for wheezing or shortness of breath (cough). 18 g 1   Azelastine-Fluticasone 137-50 MCG/ACT SUSP Place 1 spray into the nose in the morning and at bedtime. 23 g 5   fluticasone furoate-vilanterol (BREO ELLIPTA) 200-25 MCG/INH AEPB Inhale 1 puff into the lungs daily. Rinse mouth after each use. 60 each 5   No current facility-administered medications for this visit.   Allergies: Allergies  Allergen Reactions   Prozac [Fluoxetine Hcl] Hives   I reviewed his past medical history, social history, family history, and environmental history and no significant changes have been reported from his previous  visit.  Review of Systems  Constitutional:  Negative for appetite change, chills, fever and unexpected weight change.  HENT:  Positive for postnasal drip. Negative for congestion and rhinorrhea.   Eyes:  Negative for itching.  Respiratory:  Positive for cough, chest tightness, shortness of breath and wheezing.   Cardiovascular:  Negative for chest pain.  Gastrointestinal:  Negative for abdominal pain.  Genitourinary:  Negative for difficulty urinating.  Skin:  Negative for rash.  Allergic/Immunologic: Negative for food allergies.  Neurological:  Negative for headaches.   Objective: BP 132/78   Pulse (!) 52   Temp (!) 97.2 F (36.2 C) (Temporal)   Resp 12   Ht 6\' 3"  (1.905 m)   Wt 246 lb (111.6 kg)   SpO2 97%   BMI 30.75 kg/m  Body mass index is 30.75 kg/m. Physical Exam Vitals and nursing note reviewed.  Constitutional:      Appearance: Normal appearance. He is well-developed.  HENT:     Head: Normocephalic and atraumatic.  Right Ear: External ear normal.     Left Ear: External ear normal.     Nose: Nose normal.  Eyes:     Conjunctiva/sclera: Conjunctivae normal.  Cardiovascular:     Rate and Rhythm: Normal rate and regular rhythm.     Heart sounds: Normal heart sounds. No murmur heard.   No friction rub. No gallop.  Pulmonary:     Effort: Pulmonary effort is normal.     Breath sounds: Normal breath sounds. No wheezing or rales.  Musculoskeletal:     Cervical back: Neck supple.  Skin:    General: Skin is warm.     Findings: No rash.  Neurological:     Mental Status: He is alert and oriented to person, place, and time.  Psychiatric:        Behavior: Behavior normal.   Previous notes and tests were reviewed. The plan was reviewed with the patient/family, and all questions/concerned were addressed.  It was my pleasure to see Jefrey today and participate in his care. Please feel free to contact me with any questions or concerns.  Sincerely,  Rexene Alberts,  DO Allergy & Immunology  [facility] office: [place] office: [phone]

## 2021-02-09 ENCOUNTER — Other Ambulatory Visit: Payer: Self-pay

## 2021-02-09 ENCOUNTER — Ambulatory Visit: Payer: BC Managed Care – PPO | Admitting: Allergy

## 2021-02-09 ENCOUNTER — Encounter: Payer: Self-pay | Admitting: Allergy

## 2021-02-09 VITALS — BP 132/78 | HR 52 | Temp 97.2°F | Resp 12 | Ht 75.0 in | Wt 246.0 lb

## 2021-02-09 DIAGNOSIS — R0982 Postnasal drip: Secondary | ICD-10-CM

## 2021-02-09 DIAGNOSIS — J4540 Moderate persistent asthma, uncomplicated: Secondary | ICD-10-CM | POA: Diagnosis not present

## 2021-02-09 MED ORDER — AZELASTINE-FLUTICASONE 137-50 MCG/ACT NA SUSP: 1.0000 | Freq: Two times a day (BID) | NASAL | 5 refills | Status: DC

## 2021-02-09 MED ORDER — ALBUTEROL SULFATE HFA 108 (90 BASE) MCG/ACT IN AERS: 2.0000 | INHALATION_SPRAY | RESPIRATORY_TRACT | 1 refills | Status: DC | PRN

## 2021-02-09 MED ORDER — FLUTICASONE FUROATE-VILANTEROL 200-25 MCG/INH IN AEPB: 1.0000 | INHALATION_SPRAY | Freq: Every day | RESPIRATORY_TRACT | 5 refills | Status: DC

## 2021-02-09 NOTE — Assessment & Plan Note (Signed)
Past history - Postnasal drip and throat clearing for 15 years.  No specific triggers noted.  Tried Nasonex inconsistently and Protonix with unknown benefit.  No recent ENT evaluation.  Skin testing over 10 years ago was negative per patient report. Interim history - unchanged. Not taking any medications and not sure if he ever used the dymista.  Start dymista (fluticasone + azelastine nasal spray combination) 1 spray per nostril twice a day.  If it's not covered let us know.   Recommend environmental allergy testing in the future once asthma is better controlled.

## 2021-02-09 NOTE — Assessment & Plan Note (Signed)
Past history - Diagnosed with asthma over 40 years ago. Tried Advair and Symbicort.  Main triggers are unknown but exertion makes things worse.  2020 spirometry showed mild obstruction with 8% improvement in FEV1 post bronchodilator treatment.  Clinically also feeling better. Interim history - patient ran out of Breo 1 month ago and having worsening symptoms. He is not sure why the dose of Breo was changed from 264mcg to 125mcg. Denies reflux/heartburn/cardiac issues.  Today's spirometry showed: moderate obstructive disease with 8% and greater than 200cc improvement in FEV1 post bronchodilator treatment. Clinically feeling slightly improved. . Daily controller medication(s): start Breo 264mcg 1 puff daily. Rinse mouth after each use.  . Prior to physical activity: May use albuterol rescue inhaler 2 puffs 5 to 15 minutes prior to strenuous physical activities. Marland Kitchen Rescue medications: May use albuterol rescue inhaler 2 puffs or nebulizer every 4 to 6 hours as needed for shortness of breath, chest tightness, coughing, and wheezing. Monitor frequency of use.  . Get spirometry at next visit.  If chest tightness persists recommend following up with PCP as well.

## 2021-02-09 NOTE — Assessment & Plan Note (Signed)
>>  ASSESSMENT AND PLAN FOR POST-NASAL DRIP WRITTEN ON 02/09/2021  5:00 PM BY Garnet Sierras, DO  Past history - Postnasal drip and throat clearing for 15 years.  No specific triggers noted.  Tried Nasonex inconsistently and Protonix with unknown benefit.  No recent ENT evaluation.  Skin testing over 10 years ago was negative per patient report. Interim history - unchanged. Not taking any medications and not sure if he ever used the dymista.  Start dymista (fluticasone + azelastine nasal spray combination) 1 spray per nostril twice a day.  If it's not covered let us know.   Recommend environmental allergy testing in the future once asthma is better controlled.

## 2021-02-09 NOTE — Patient Instructions (Addendum)
Asthma: Daily controller medication(s): start Breo 259mcg 1 puff daily. Rinse mouth after each use. Prior to physical activity: May use albuterol rescue inhaler 2 puffs 5 to 15 minutes prior to strenuous physical activities. Rescue medications: May use albuterol rescue inhaler 2 puffs or nebulizer every 4 to 6 hours as needed for shortness of breath, chest tightness, coughing, and wheezing. Monitor frequency of use.  Asthma control goals:  Full participation in all desired activities (may need albuterol before activity) Albuterol use two times or less a week on average (not counting use with activity) Cough interfering with sleep two times or less a month Oral steroids no more than once a year No hospitalizations  Rhinitis/throat clearing: Start dymista (fluticasone + azelastine nasal spray combination) 1 spray per nostril twice a day. If it's not covered let us know.   Recommend environmental allergy testing in the future once asthma is better controlled.   Follow up in 4 months or sooner if needed.  If chest tightness persists recommend following up with your PCP as well.

## 2021-02-10 ENCOUNTER — Telehealth: Payer: Self-pay | Admitting: *Deleted

## 2021-02-10 NOTE — Telephone Encounter (Signed)
Patient's Rx info is ID [id number], BIN [id number], GRP [id number], PCN [id number].

## 2021-02-10 NOTE — Telephone Encounter (Signed)
PA for Azelastine-Fluticasone has been submitted through CoverMyMeds and is currently pending approval/denial.

## 2021-02-10 NOTE — Telephone Encounter (Signed)
PA has been approved for azelastine-fluticasone. PA has been faxed to pharmacy, labeled, and placed in bulk scanning.

## 2021-02-13 ENCOUNTER — Other Ambulatory Visit: Payer: Self-pay

## 2021-02-13 MED ORDER — FLUTICASONE PROPIONATE 50 MCG/ACT NA SUSP: 2.0000 | Freq: Every day | NASAL | 5 refills | Status: DC

## 2021-02-13 MED ORDER — AZELASTINE HCL 0.1 % NA SOLN: 2.0000 | Freq: Two times a day (BID) | NASAL | 5 refills | Status: DC

## 2021-03-09 ENCOUNTER — Telehealth: Payer: Self-pay | Admitting: Family Medicine

## 2021-03-09 DIAGNOSIS — F429 Obsessive-compulsive disorder, unspecified: Secondary | ICD-10-CM

## 2021-03-09 NOTE — Telephone Encounter (Signed)
Patient called and wanted to schedule an office visit to get citalopram (CELEXA) 40 MG tablet refilled. The next available date is Monday September 12th. Patient accepted the appointment but is almost out of the medication. He wants to know if it was possible to see another provider or if it was something else he could do.  Patient contact number is [phone].  Please advise.

## 2021-03-10 MED ORDER — CITALOPRAM HYDROBROMIDE 40 MG PO TABS: ORAL_TABLET | ORAL | 1 refills | Status: DC

## 2021-03-10 NOTE — Telephone Encounter (Signed)
Refilled patient's medication until he is able to come in for his appointment on 9/12.

## 2021-03-28 NOTE — Progress Notes (Signed)
NEUROLOGY CONSULTATION NOTE  Antonio House MRN: [id number] DOB: 1971-06-21  Referring provider: self Primary care provider: Betty Martinique  Reason for consult:  migraines  Assessment/Plan:   Change in migraine  Ocular migraines  Due to the severity of these new headaches, will check MRI of brain with and without contrast and MRA of head and neck to rule out secondary intracranial abnormality and intracranial/extracranial vascular abnormality Migraine rescue:  Will have him try samples of both Ubrelvy and Nurtec As he only has had 2 episodes in last 5 weeks, will defer starting a preventative for now as it has not yet been established that it will be an ongoing problem.  If he has another attack, will start a preventative, preferably a CGRP inhibitor. Limit use of pain relievers to no more than 2 days out of week to prevent risk of rebound or medication-overuse headache. Keep headache diary Follow up 3 months,    Subjective:  Antonio House is a 50 year old right-handed male with asthma who presents for migraines.  He is accompanied by his wife who supplements history.  History of ocular migraines for 20 years.  Sees a blind spot that develops a shimmering halo and gradually enlarges over 15 minutes before it resolves.  Sometimes may be followed by a dull diffuse headache.  Usually occurs once every 6 weeks (often associated with the resolution of a stressful period, such as finishing a work-related project).  Over the past 5 weeks, he developed a different headache.  The first one occurred out of sleep.  He describes a severe mid-frontal throbbing/pounding headache associated with nausea, photophobia and phonophobia but no visual disturbance, numbness or weakness.  Worse when supine and improved sitting upright or standing.  He had a second episode 4-5 weeks later while awake.  No preceding visual aura.  '800mg'$  ibuprofen helps.  He will go to sleep and when he wakes up a couple  of hours later, it is resolved.  On the day of both events, he noted feeling weak and tired after going for a run.    He cannot recall any preceding trigger.  He did not recently finish a stressful project.    Current NSAIDS/analgesics:  ibuprofen '800mg'$  Current triptans:  none Current ergotamine:  none Current anti-emetic:  Zofran Current muscle relaxants:  none Current Antihypertensive medications:  none Current Antidepressant medications:  citalopram '40mg'$  QD Current Anticonvulsant medications:  none Current anti-CGRP:  none Current Vitamins/Herbal/Supplements:  none Current Antihistamines/Decongestants:  Flonase, Astelin Other therapy:  sleep Hormone/birth control:  none   Past NSAIDS/analgesics:  none Past abortive triptans:  none Past abortive ergotamine:  none Past muscle relaxants:  none Past anti-emetic:  none Past antihypertensive medications:  none Past antidepressant medications: Zolft, Lexapro, Paxil, Prozac (hives) Past anticonvulsant medications:  none Past anti-CGRP:  none Past vitamins/Herbal/Supplements:  none Past antihistamines/decongestants:  none Other past therapies:  none   Caffeine:  7-8 cups of coffee daily Diet:  Does not drink much water.  Does not skip meals. Exercise:  Yes.  Runs several times a week Depression:  yes; Anxiety:  yes.  Controlled. Other pain:  some knee pain when running Sleep:  Usually good but recently a little more insomnia.  Harder time falling asleep. Family history of headache:  Mother (migraines), sister (migraine), niece (migraines).  Maternal grandmother had a hemorrhagic stroke.        PAST MEDICAL HISTORY: Past Medical History:  Diagnosis Date   Asthma    Hx  of migraines    Hyperlipidemia     PAST SURGICAL HISTORY: Past Surgical History:  Procedure Laterality Date   WISDOM TOOTH EXTRACTION      MEDICATIONS: Current Outpatient Medications on File Prior to Visit  Medication Sig Dispense Refill   albuterol  (VENTOLIN HFA) 108 (90 Base) MCG/ACT inhaler Inhale 2 puffs into the lungs every 4 (four) hours as needed for wheezing or shortness of breath (cough). 18 g 1   azelastine (ASTELIN) 0.1 % nasal spray Place 2 sprays into both nostrils 2 (two) times daily. Use in each nostril as directed 30 mL 5   Azelastine-Fluticasone 137-50 MCG/ACT SUSP Place 1 spray into the nose in the morning and at bedtime. 23 g 5   citalopram (CELEXA) 40 MG tablet TAKE 1 TABLET(40 MG) BY MOUTH DAILY 30 tablet 1   fluticasone (FLONASE) 50 MCG/ACT nasal spray Place 2 sprays into both nostrils daily. 16 g 5   fluticasone furoate-vilanterol (BREO ELLIPTA) 200-25 MCG/INH AEPB Inhale 1 puff into the lungs daily. Rinse mouth after each use. 60 each 5   ketoconazole (NIZORAL) 2 % shampoo APPLY TO SCALP EVERY DAY AS DIRECTED     No current facility-administered medications on file prior to visit.    ALLERGIES: Allergies  Allergen Reactions   Prozac [Fluoxetine Hcl] Hives    FAMILY HISTORY: Family History  Problem Relation Age of Onset   Cancer Mother    Early death Mother    Hypertension Mother    COPD Father    Heart disease Father    Stroke Maternal Grandmother    Alcohol abuse Maternal Grandfather    Birth defects Maternal Grandfather    Early death Maternal Grandfather    Heart disease Paternal Grandmother    Stroke Paternal Grandfather     Objective:  Blood pressure 132/81, pulse (!) 46, height '6\' 3"'$  (1.905 m), weight 243 lb 12.8 oz (110.6 kg), SpO2 98 %. General: No acute distress.  Patient appears well-groomed.   Head:  Normocephalic/atraumatic Eyes:  fundi examined but not visualized Neck: supple, no paraspinal tenderness, full range of motion Back: No paraspinal tenderness Heart: regular rate and rhythm Lungs: Clear to auscultation bilaterally. Vascular: No carotid bruits. Neurological Exam: Mental status: alert and oriented to person, place, and time, recent and remote memory intact, fund of  knowledge intact, attention and concentration intact, speech fluent and not dysarthric, language intact. Cranial nerves: CN I: not tested CN II: pupils equal, round and reactive to light, visual fields intact CN III, IV, VI:  full range of motion, no nystagmus, no ptosis CN V: facial sensation intact. CN VII: upper and lower face symmetric CN VIII: hearing intact CN IX, X: gag intact, uvula midline CN XI: sternocleidomastoid and trapezius muscles intact CN XII: tongue midline Bulk & Tone: normal, no fasciculations. Motor:  muscle strength 5/5 throughout Sensation:  Pinprick, temperature and vibratory sensation intact. Deep Tendon Reflexes:  2+ throughout,  toes downgoing.   Finger to nose testing:  Without dysmetria.   Heel to shin:  Without dysmetria.   Gait:  Normal station and stride.  Romberg negative.    Thank you for allowing me to take part in the care of this patient.  Metta Clines, DO  CC: Betty Martinique, MD

## 2021-03-29 ENCOUNTER — Encounter: Payer: Self-pay | Admitting: Neurology

## 2021-03-29 ENCOUNTER — Other Ambulatory Visit: Payer: Self-pay

## 2021-03-29 ENCOUNTER — Ambulatory Visit: Payer: BC Managed Care – PPO | Admitting: Neurology

## 2021-03-29 VITALS — BP 132/81 | HR 46 | Ht 75.0 in | Wt 243.8 lb

## 2021-03-29 DIAGNOSIS — R519 Headache, unspecified: Secondary | ICD-10-CM | POA: Diagnosis not present

## 2021-03-29 DIAGNOSIS — G43109 Migraine with aura, not intractable, without status migrainosus: Secondary | ICD-10-CM

## 2021-03-29 DIAGNOSIS — Z8249 Family history of ischemic heart disease and other diseases of the circulatory system: Secondary | ICD-10-CM | POA: Diagnosis not present

## 2021-03-29 NOTE — Patient Instructions (Signed)
Will check MRI of brain with and without contrast and MRA of head and neck When you get a migraine, take Nurtec at earliest onset.  Maximum 1 tablet in 24 hours.  If no relief after 2 hours, may take ibuprofen '800mg'$  If Nurtec ineffective, then next time you have a migraine, may take Ubrelvy '100mg'$ .  May repeat in 2 hours if needed.  Maximum 2 tablets in 24 hours Further recommendations pending results.

## 2021-03-30 ENCOUNTER — Encounter: Payer: Self-pay | Admitting: Neurology

## 2021-04-07 NOTE — Progress Notes (Addendum)
HPI: [name] is a 50 y.o. male, who is here today for medication follow up.   He was last seen on 10/13/19. He would like labs done today, including prostate cancer screening.  Hyperlipidemia: Currently on non pharmacologic treatment. Negative for CP,SOB,palpitations,or edema.  Lab Results  Component Value Date   CHOL 207 (H) 12/17/2017   HDL 43.40 12/17/2017   LDLCALC 138 (H) 12/17/2017   TRIG 128.0 12/17/2017   CHOLHDL 5 12/17/2017   Migraine headaches: For the past couple months he has had 2 episodes of severe headache with some changes from his typical headache. + Nausea and photophobia.  Usually he has visual aura with mild headache but the last 2 episodes have presented with severe headache but no visual aura.  Headache woke him up x 1. No associated conjunctival erythema or epiphora. Headaches exacerbated by stress, no new stressors or changes in his diet.  Negative for focal weakness or MS changes. He has an appt with neuro already.  OCD on Celexa 40 mg daily. He has been on same medication for about 15-18 years. He tried other medications, did not help. Medication is still helping and has been well tolerated. Evaluated by psychiatrist when he was first Dx'ed. Negative for depression like symptoms.  Seborrheic dermatitis: Usually affects scalp and areas behind ears. He has been on Nizoral shampoo, which has helped. He needs a refill. Not identified exacerbating or alleviating factors.  Review of Systems  Constitutional:  Negative for activity change, appetite change, fatigue and fever.  HENT:  Negative for nosebleeds, sore throat and trouble swallowing.   Eyes:  Negative for pain and redness.  Respiratory:  Negative for cough and wheezing.   Gastrointestinal:  Negative for abdominal pain and vomiting.  Genitourinary:  Negative for decreased urine volume and hematuria.  Musculoskeletal:  Negative for gait problem and myalgias.   Neurological:  Negative for syncope, facial asymmetry, speech difficulty and weakness.  Psychiatric/Behavioral:  Negative for confusion and hallucinations.   Rest of ROS, see pertinent positives sand negatives in HPI  Current Outpatient Medications on File Prior to Visit  Medication Sig Dispense Refill   albuterol (VENTOLIN HFA) 108 (90 Base) MCG/ACT inhaler Inhale 2 puffs into the lungs every 4 (four) hours as needed for wheezing or shortness of breath (cough). 18 g 1   azelastine (ASTELIN) 0.1 % nasal spray Place 2 sprays into both nostrils 2 (two) times daily. Use in each nostril as directed 30 mL 5   Azelastine-Fluticasone 137-50 MCG/ACT SUSP Place 1 spray into the nose in the morning and at bedtime. 23 g 5   fluticasone (FLONASE) 50 MCG/ACT nasal spray Place 2 sprays into both nostrils daily. 16 g 5   fluticasone furoate-vilanterol (BREO ELLIPTA) 200-25 MCG/INH AEPB Inhale 1 puff into the lungs daily. Rinse mouth after each use. 60 each 5   No current facility-administered medications on file prior to visit.   Past Medical History:  Diagnosis Date   Asthma    Hx of migraines    Occular   Hyperlipidemia    Allergies  Allergen Reactions   Prozac [Fluoxetine Hcl] Hives   Social History   Socioeconomic History   Marital status: Married    Spouse name: Not on file   Number of children: Not on file   Years of education: Not on file   Highest education level: Not on file  Occupational History   Not on file  Tobacco Use   Smoking status: Never  Smokeless tobacco: Never  Vaping Use   Vaping Use: Never used  Substance and Sexual Activity   Alcohol use: Yes   Drug use: Never   Sexual activity: Yes    Partners: Female  Other Topics Concern   Not on file  Social History Narrative   Not on file   Social Determinants of Health   Financial Resource Strain: Not on file  Food Insecurity: Not on file  Transportation Needs: Not on file  Physical Activity: Not on file   Stress: Not on file  Social Connections: Not on file   Vitals:   04/10/21 1354  BP: 120/80  Pulse: (!) 51  Resp: 12  SpO2: 97%   Body mass index is 30.31 kg/m.  Physical Exam Nursing note reviewed.  Constitutional:      General: He is not in acute distress.    Appearance: He is well-developed.  HENT:     Head: Normocephalic and atraumatic.  Eyes:     Conjunctiva/sclera: Conjunctivae normal.  Cardiovascular:     Rate and Rhythm: Regular rhythm. Bradycardia present.     Pulses:          Dorsalis pedis pulses are 2+ on the right side and 2+ on the left side.     Heart sounds: No murmur heard. Pulmonary:     Effort: Pulmonary effort is normal. No respiratory distress.     Breath sounds: Normal breath sounds.  Abdominal:     Palpations: Abdomen is soft. There is no hepatomegaly or mass.     Tenderness: There is no abdominal tenderness.  Lymphadenopathy:     Cervical: No cervical adenopathy.  Skin:    General: Skin is warm.     Findings: No erythema or rash.     Comments: No scalp lesions.  Neurological:     Mental Status: He is alert and oriented to person, place, and time.     Cranial Nerves: No cranial nerve deficit.     Gait: Gait normal.  Psychiatric:     Comments: Well groomed, good eye contact.   ASSESSMENT AND PLAN:  [name] was seen today for follow-up.  Orders Placed This Encounter  Procedures   Flu Vaccine QUAD 66moIM (Fluarix, Fluzone & Alfiuria Quad PF)   Basic metabolic panel   Hepatitis C antibody   Lipid panel   PSA   Ambulatory referral to Gastroenterology   Lab Results  Component Value Date   PSA 1.80 04/10/2021   Lab Results  Component Value Date   CREATININE 1.10 04/10/2021   BUN 12 04/10/2021   NA 139 04/10/2021   K 4.2 04/10/2021   CL 103 04/10/2021   CO2 25 04/10/2021    Lab Results  Component Value Date   CHOL 235 (H) 04/10/2021   HDL 44.50 04/10/2021   LDLCALC 157 (H) 04/10/2021   TRIG 167.0 (H)  04/10/2021   CHOLHDL 5 04/10/2021   The 10-year ASCVD risk score (Arnett DK, et al., 2019) is: 4%   Values used to calculate the score:     Age: [age]years     Sex: Male     Is Non-Hispanic African American: No     Diabetic: No     Tobacco smoker: No     Systolic Blood Pressure: 1123456mmHg     Is BP treated: No     HDL Cholesterol: 44.5 mg/dL     Total Cholesterol: 235 mg/dL  Obsessive-compulsive disorder Problem is well controled. Continue Celexa  40 mg daily. Continue annual follow up, before if needed.  Hyperlipidemia Non pharmacologic treatment to continue for now. Further recommendations will be given according to 10 years CVD risk score and lipid panel numbers.  Seborrheic dermatitis We discussed Dx,prognosis,and treatment. Well controlled with Nizoral shampoo, so continue daily as needed.  Prostate cancer screening -     PSA  Colon cancer screening -     Ambulatory referral to Gastroenterology  Encounter for HCV screening test for low risk patient -     Hepatitis C antibody  Need for influenza vaccination -     Flu Vaccine QUAD 69moIM (Fluarix, Fluzone & Alfiuria Quad PF)  Migraine with aura and without status migrainosus, not intractable With recent changes in presentation. He has an appt with neurologist.  Return in about 1 year (around 04/10/2022) for CPE and f/u.   Jadalee Westcott G. JMartinique MD  [facility] [place].

## 2021-04-10 ENCOUNTER — Encounter: Payer: Self-pay | Admitting: Family Medicine

## 2021-04-10 ENCOUNTER — Ambulatory Visit: Payer: BC Managed Care – PPO | Admitting: Family Medicine

## 2021-04-10 ENCOUNTER — Other Ambulatory Visit: Payer: Self-pay

## 2021-04-10 VITALS — BP 120/80 | HR 51 | Resp 12 | Ht 75.0 in | Wt 242.5 lb

## 2021-04-10 DIAGNOSIS — L219 Seborrheic dermatitis, unspecified: Secondary | ICD-10-CM | POA: Insufficient documentation

## 2021-04-10 DIAGNOSIS — F429 Obsessive-compulsive disorder, unspecified: Secondary | ICD-10-CM | POA: Diagnosis not present

## 2021-04-10 DIAGNOSIS — Z1159 Encounter for screening for other viral diseases: Secondary | ICD-10-CM | POA: Diagnosis not present

## 2021-04-10 DIAGNOSIS — Z23 Encounter for immunization: Secondary | ICD-10-CM | POA: Diagnosis not present

## 2021-04-10 DIAGNOSIS — Z125 Encounter for screening for malignant neoplasm of prostate: Secondary | ICD-10-CM | POA: Diagnosis not present

## 2021-04-10 DIAGNOSIS — E785 Hyperlipidemia, unspecified: Secondary | ICD-10-CM

## 2021-04-10 DIAGNOSIS — Z1211 Encounter for screening for malignant neoplasm of colon: Secondary | ICD-10-CM

## 2021-04-10 DIAGNOSIS — G43109 Migraine with aura, not intractable, without status migrainosus: Secondary | ICD-10-CM

## 2021-04-10 LAB — BASIC METABOLIC PANEL
BUN: 12 mg/dL (ref 6–23)
CO2: 25 mEq/L (ref 19–32)
Calcium: 9.8 mg/dL (ref 8.4–10.5)
Chloride: 103 mEq/L (ref 96–112)
Creatinine, Ser: 1.1 mg/dL (ref 0.40–1.50)
GFR: 78.56 mL/min (ref >60.00)
Glucose, Bld: 80 mg/dL (ref 70–99)
Potassium: 4.2 mEq/L (ref 3.5–5.1)
Sodium: 139 mEq/L (ref 135–145)

## 2021-04-10 LAB — LIPID PANEL
Cholesterol: 235 mg/dL — ABNORMAL HIGH (ref 0–200)
HDL: 44.5 mg/dL (ref >39.00)
LDL Cholesterol: 157 mg/dL — ABNORMAL HIGH (ref 0–99)
NonHDL: 190.15
Total CHOL/HDL Ratio: 5
Triglycerides: 167 mg/dL — ABNORMAL HIGH (ref 0.0–149.0)
VLDL: 33.4 mg/dL (ref 0.0–40.0)

## 2021-04-10 LAB — PSA: PSA: 1.8 ng/mL (ref 0.10–4.00)

## 2021-04-10 MED ORDER — KETOCONAZOLE 2 % EX SHAM: MEDICATED_SHAMPOO | CUTANEOUS | 6 refills | Status: DC

## 2021-04-10 MED ORDER — CITALOPRAM HYDROBROMIDE 40 MG PO TABS: ORAL_TABLET | ORAL | 3 refills | Status: DC

## 2021-04-10 NOTE — Patient Instructions (Signed)
A few things to remember from today's visit:   Hyperlipidemia, unspecified hyperlipidemia type - Plan: Basic metabolic panel, Lipid panel  Obsessive-compulsive disorder, unspecified type - Plan: citalopram (CELEXA) 40 MG tablet  Seborrheic dermatitis  Prostate cancer screening - Plan: PSA  Colon cancer screening  Encounter for HCV screening test for low risk patient - Plan: Hepatitis C antibody  If you need refills please call your pharmacy. Do not use My Chart to request refills or for acute issues that need immediate attention.   No changes today.  Please be sure medication list is accurate. If a new problem present, please set up appointment sooner than planned today.

## 2021-04-10 NOTE — Assessment & Plan Note (Addendum)
Non pharmacologic treatment to continue for now. Further recommendations will be given according to 10 years CVD risk score and lipid panel numbers.  

## 2021-04-10 NOTE — Assessment & Plan Note (Signed)
We discussed Dx,prognosis,and treatment. Well controlled with Nizoral shampoo, so continue daily as needed.

## 2021-04-10 NOTE — Assessment & Plan Note (Signed)
Problem is well controled. Continue Celexa 40 mg daily. Continue annual follow up, before if needed.

## 2021-04-11 LAB — HEPATITIS C ANTIBODY
Hepatitis C Ab: NONREACTIVE
SIGNAL TO CUT-OFF: 0.01 (ref <1.00)

## 2021-04-18 ENCOUNTER — Ambulatory Visit
Admission: RE | Admit: 2021-04-18 | Discharge: 2021-04-18 | Disposition: A | Discharge status: Home / Self Care | Payer: BC Managed Care – PPO | Source: Ambulatory Visit | Attending: Neurology | Admitting: Neurology

## 2021-04-18 ENCOUNTER — Other Ambulatory Visit: Payer: Self-pay

## 2021-04-18 DIAGNOSIS — G43109 Migraine with aura, not intractable, without status migrainosus: Secondary | ICD-10-CM

## 2021-04-18 DIAGNOSIS — Z8249 Family history of ischemic heart disease and other diseases of the circulatory system: Secondary | ICD-10-CM

## 2021-04-18 DIAGNOSIS — R519 Headache, unspecified: Secondary | ICD-10-CM

## 2021-04-18 DIAGNOSIS — G43909 Migraine, unspecified, not intractable, without status migrainosus: Secondary | ICD-10-CM | POA: Diagnosis not present

## 2021-04-18 DIAGNOSIS — J392 Other diseases of pharynx: Secondary | ICD-10-CM | POA: Diagnosis not present

## 2021-04-18 DIAGNOSIS — Z8679 Personal history of other diseases of the circulatory system: Secondary | ICD-10-CM | POA: Diagnosis not present

## 2021-04-18 IMAGING — MR MR HEAD WO/W CM
12 series · 48 of 48 positions shown · IV contrast (20 ml multihance)
Comparison: Same-day MRA head and MRA neck [DATE].

CLINICAL DATA: Sudden onset of severe headache. Ocular migraine.
Family history of cerebral aneurysm. Cerebral aneurysm screening,
genetic risk. Additional history provided by scanning technologist:
Patient reports migraine headaches for 1 month.

EXAM:
MRI HEAD WITHOUT AND WITH CONTRAST
TECHNIQUE: Multiplanar, multiecho pulse sequences of the brain and surrounding
structures were obtained without and with intravenous contrast.
CONTRAST:  20mL MULTIHANCE GADOBENATE DIMEGLUMINE 529 MG/ML IV SOLN

[Series 2: T1 · sagittal · 5.0mm · 0.45mm/px · 1 of 25 slices shown]
[im 1/25]
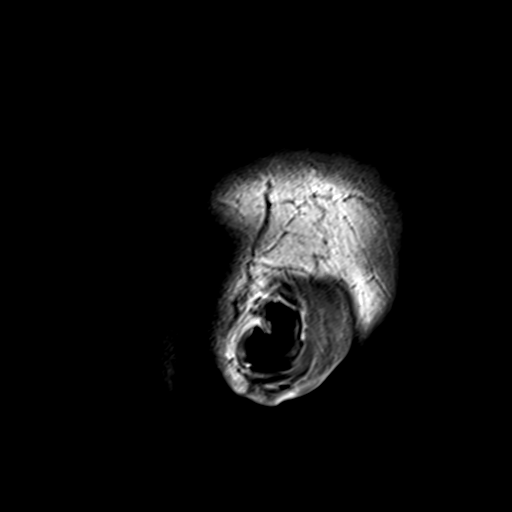

[Series 3: ax ep2d_diff_3 · axial · 3.0mm · 1.80mm/px · z∈[-55,+106]mm · 5 of 106 slices shown]
[im 1/106]
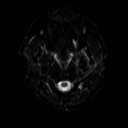
[im 27/106]
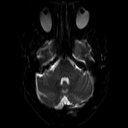
[im 53/106]
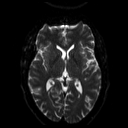
[im 79/106]
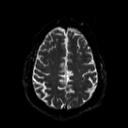
[im 106/106]
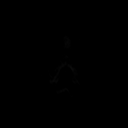

[Series 4: ax ep2d_diff_3_adc · axial · 3.0mm · 1.80mm/px · z∈[-55,+106]mm · 3 of 52 slices shown]
[im 1/52]
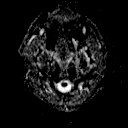
[im 26/52]
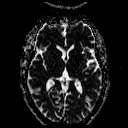
[im 52/52]
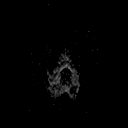

[Series 5: cor ep2d_diff · coronal · 5.0mm · 1.77mm/px · 4 of 60 slices shown]
[im 1/60]
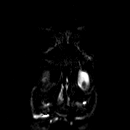
[im 20/60]
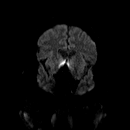
[im 40/60]
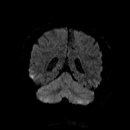
[im 60/60]
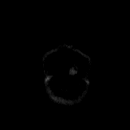

[Series 6: cor ep2d_diff_adc · coronal · 5.0mm · 1.77mm/px · 2 of 30 slices shown]
[im 1/30]
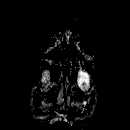
[im 30/30]
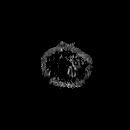

[Series 8: swi_images · axial · 2.0mm · 0.98mm/px · z∈[-52,+105]mm · 5 of 80 slices shown]
[im 1/80]
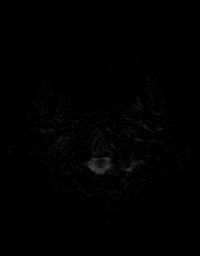
[im 20/80]
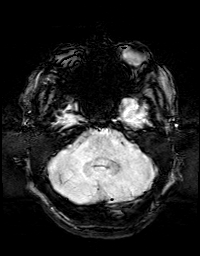
[im 40/80]
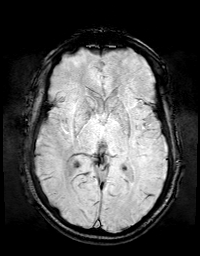
[im 60/80]
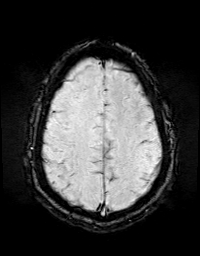
[im 80/80]
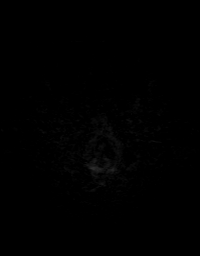

[Series 9: FLAIR · axial · 3.0mm · 0.43mm/px · z∈[-51,+101]mm · 2 of 40 slices shown]
[im 1/40]
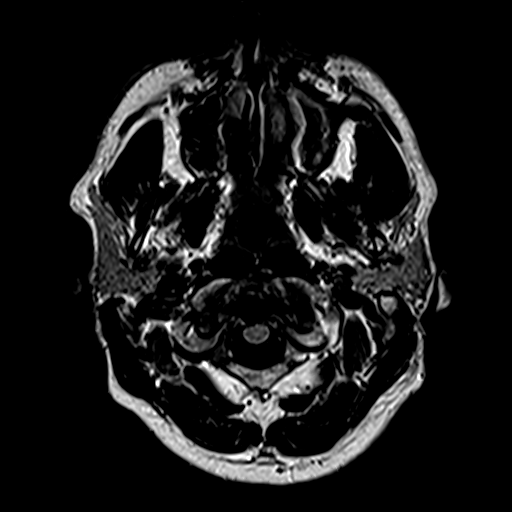
[im 40/40]
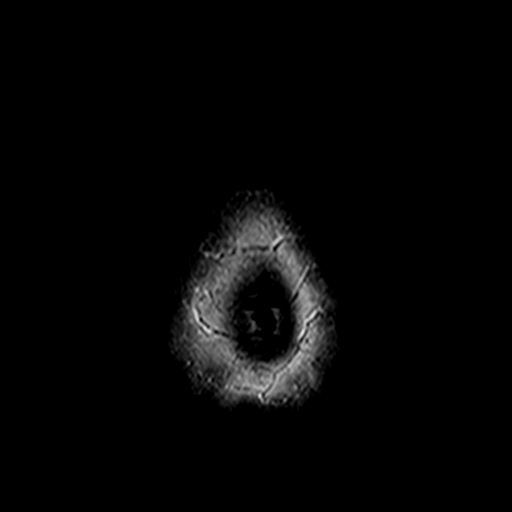

[Series 10: T2 · axial · 5.0mm · 0.65mm/px · z∈[-57,+110]mm · 2 of 29 slices shown (1 of 2)]
[im 1/29]
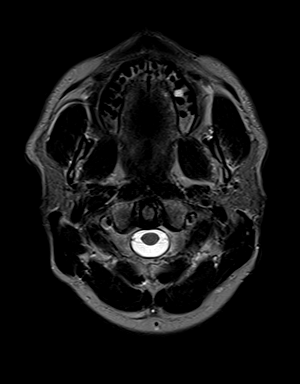
[im 29/29]
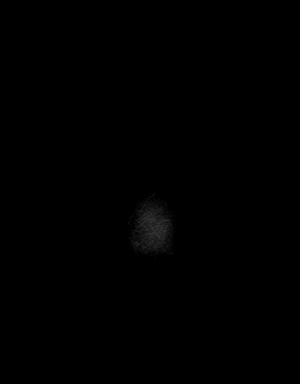

[Series 11: t1_mpr_tra · axial · 1.0mm · 0.72mm/px · z∈[-55,+103]mm · 10 of 160 slices shown]
[im 1/160]
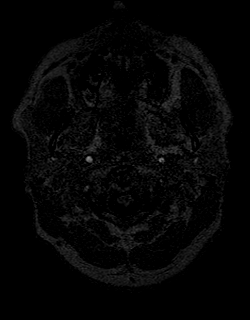
[im 18/160]
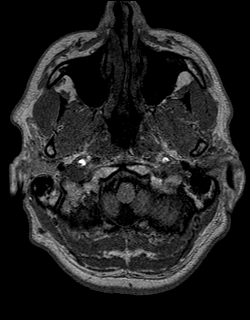
[im 36/160]
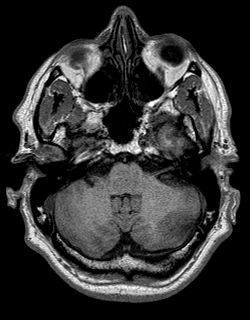
[im 54/160]
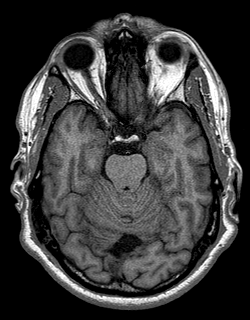
[im 71/160]
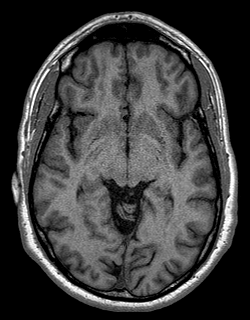
[im 89/160]
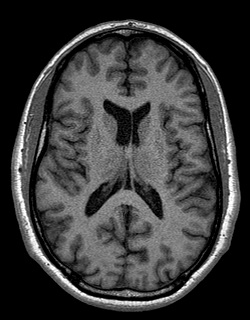
[im 107/160]
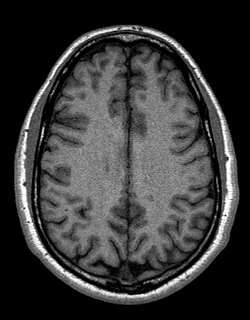
[im 124/160]
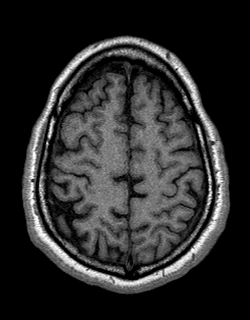
[im 142/160]
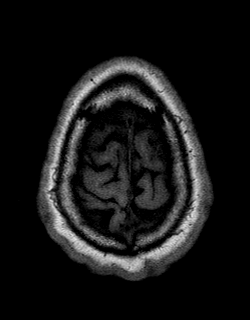
[im 160/160]
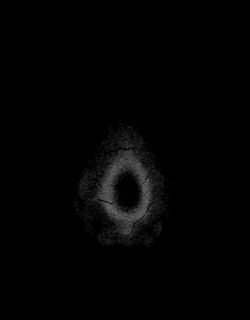

[Series 12: T2 · coronal · 5.0mm · 0.43mm/px · 2 of 28 slices shown (2 of 2)]
[im 1/28]
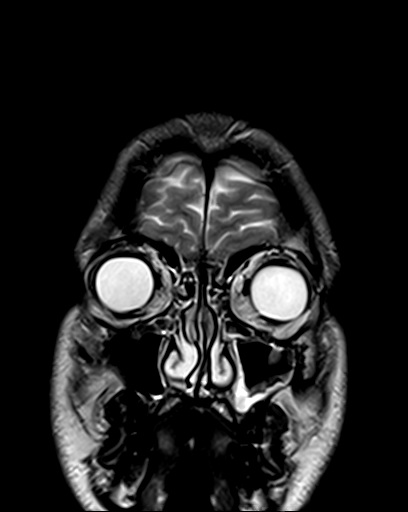
[im 28/28]
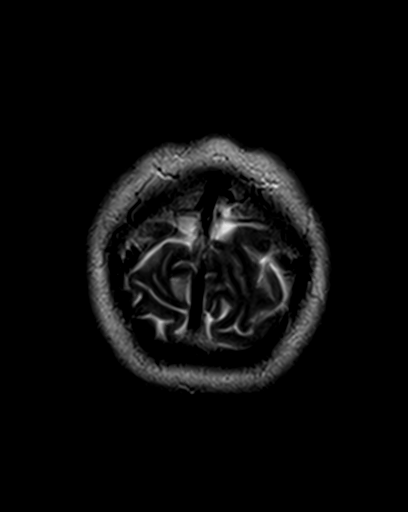

[Series 13: post t1_mpr_tra · axial · 1.0mm · 0.72mm/px · z∈[-55,+103]mm · 10 of 160 slices shown]
[im 1/160]
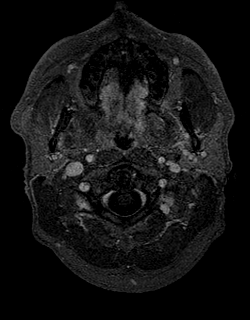
[im 18/160]
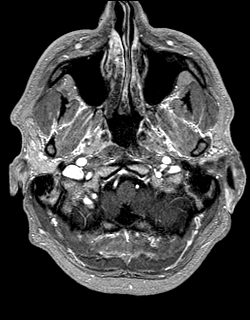
[im 36/160]
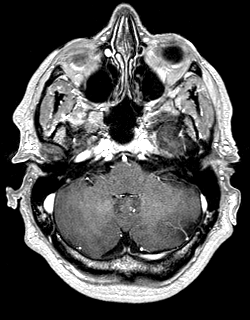
[im 54/160]
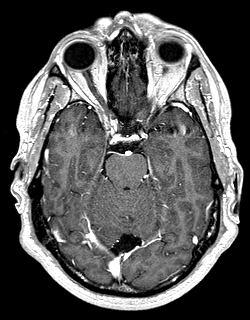
[im 71/160]
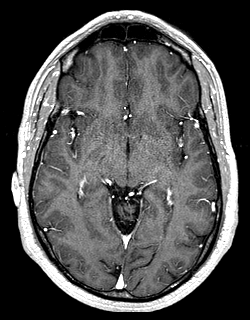
[im 89/160]
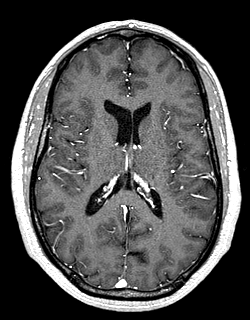
[im 107/160]
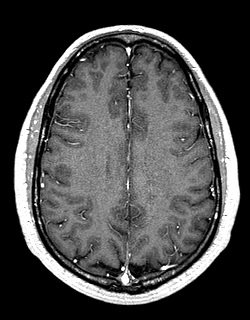
[im 124/160]
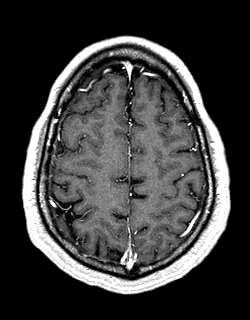
[im 142/160]
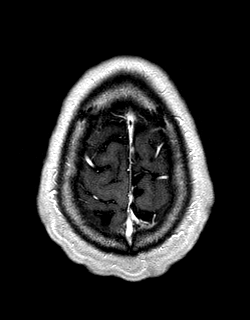
[im 160/160]
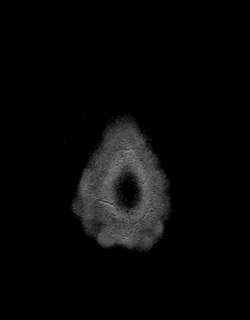

[Series 14: T1 post-contrast · coronal · 5.0mm · 0.72mm/px · 2 of 29 slices shown]
[im 1/29]
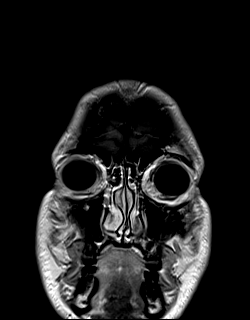
[im 29/29]
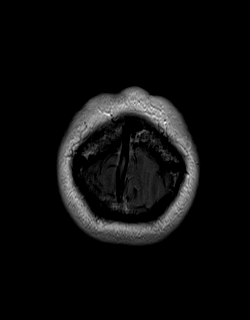

[48 of 48 positions shown; findings below may reference images not displayed]

FINDINGS: Brain:

Cerebral volume is normal for age.

No cortical encephalomalacia is identified. No significant cerebral
white matter disease.

There is no acute infarct.

No evidence of an intracranial mass.

No chronic intracranial blood products.

No extra-axial fluid collection.

No midline shift.

No pathologic intracranial enhancement identified.

Vascular: Maintained flow voids within the proximal large arterial
vessels.

Skull and upper cervical spine: No focal suspicious marrow lesion.

Sinuses/Orbits: Mild mucosal thickening within the left maxillary
sinus.

Other: Small mucous retention cyst within the posterior left
nasopharynx. Trace fluid within the bilateral mastoid air cells
IMPRESSION: Unremarkable MRI appearance of the brain. No evidence of acute
intracranial abnormality.

Mild mucosal thickening within the left maxillary sinus.

Trace fluid within the bilateral mastoid air cells.

## 2021-04-18 IMAGING — MR MR MRA HEAD W/O CM
1 series · 23 of 48 positions shown · non-contrast
Comparison: Same-day MRI brain and MRA neck [DATE].

CLINICAL DATA: Sudden onset of severe headache. Ocular migraine.
Family history of cerebral aneurysm. Cerebral aneurysm screening,
genetic risk. Additional history provided by scanning technologist:
Patient reports migraine headaches for 1 month.

EXAM:
MRA HEAD WITHOUT CONTRAST
TECHNIQUE: Angiographic images of the Circle of Willis were acquired using MRA
technique without intravenous contrast.

[Series 2: tof_3d_multi-slab new · axial · 0.7mm · 0.35mm/px · z∈[-55,+47]mm · 23 of 156 slices shown]
[im 1/156]
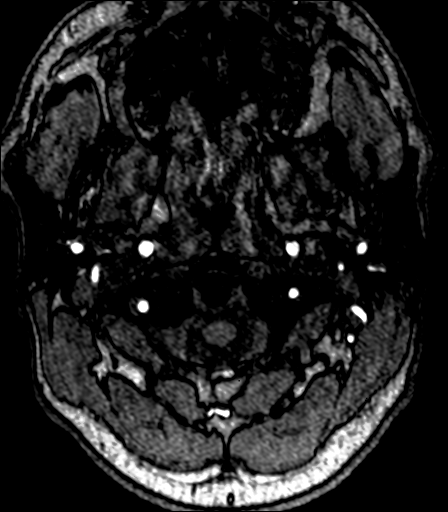
[im 4/156]
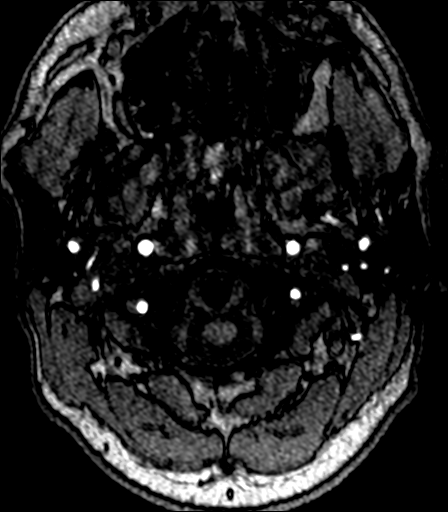
[im 7/156]
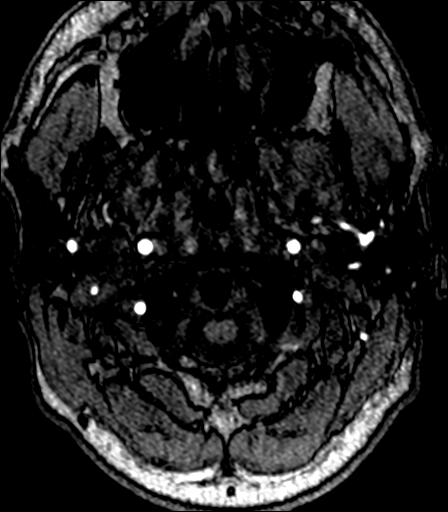
[im 10/156]
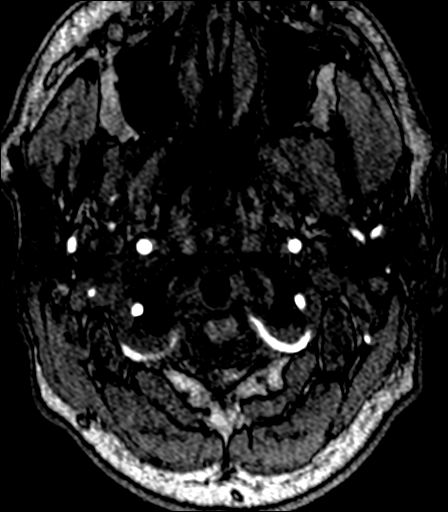
[im 14/156]
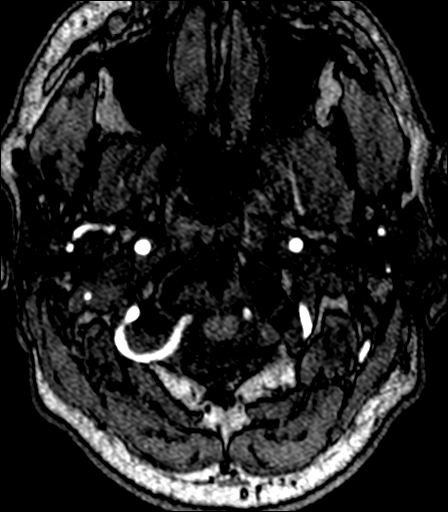
[im 17/156]
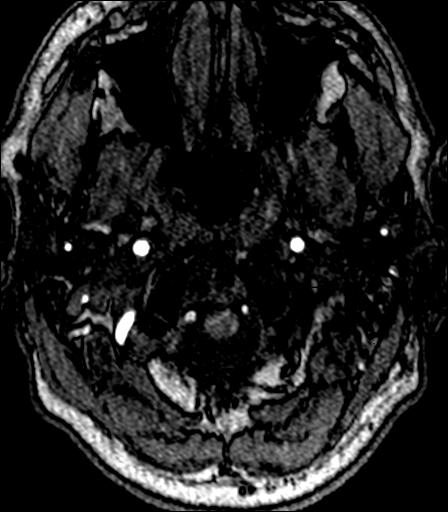
[im 20/156]
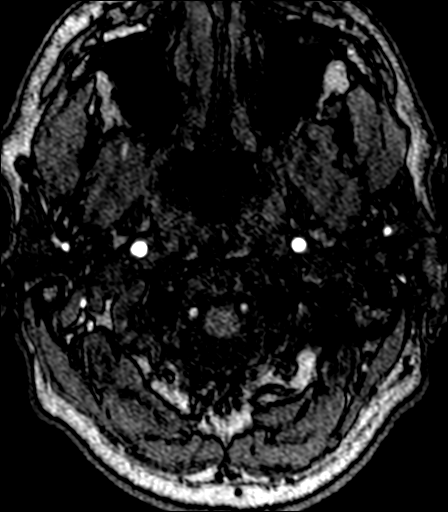
[im 24/156]
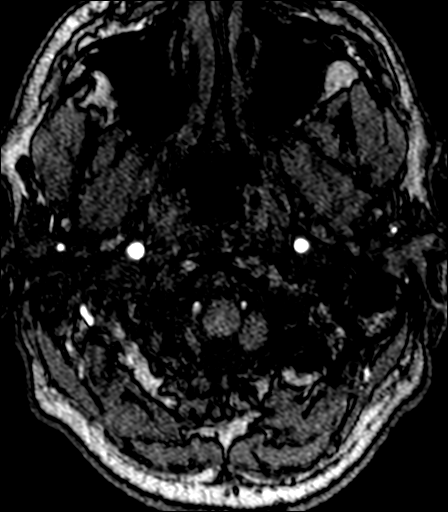
[im 27/156]
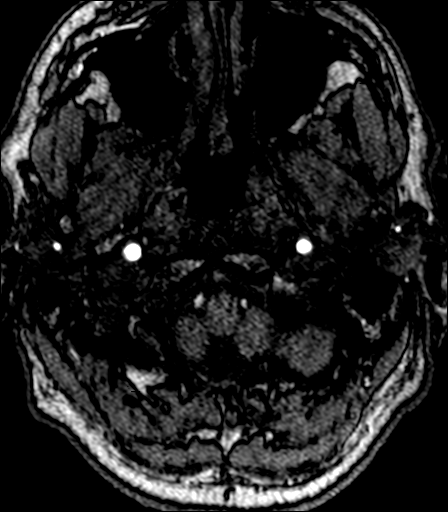
[im 30/156]
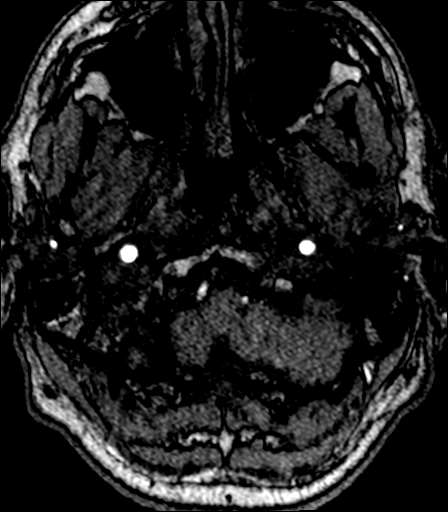
[im 33/156]
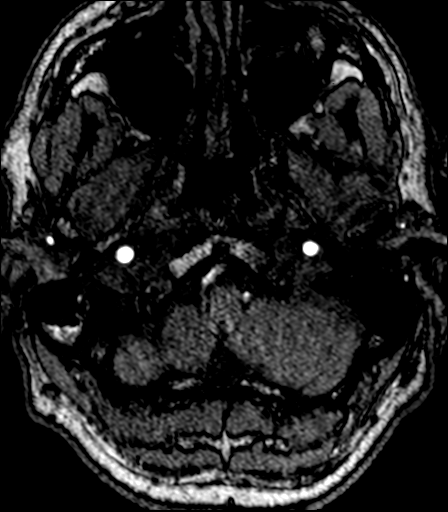
[im 37/156]
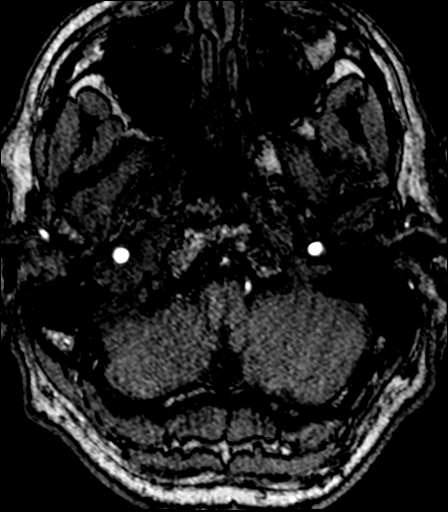
[im 40/156]
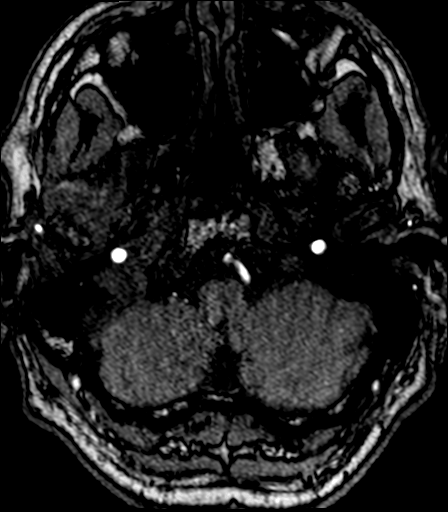
[im 43/156]
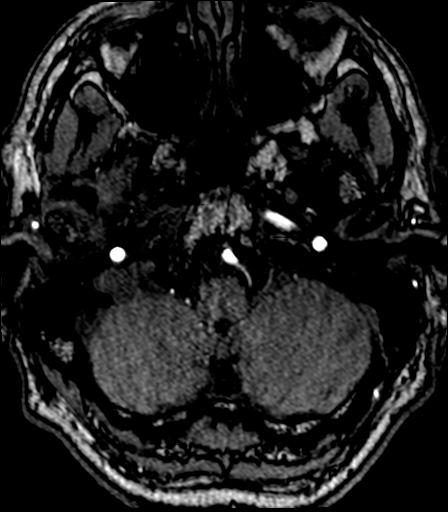
[im 47/156]
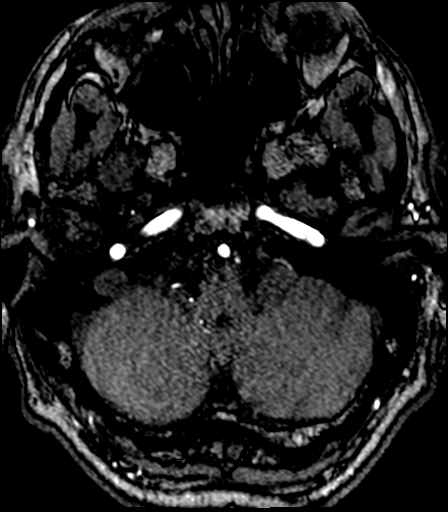
[im 50/156]
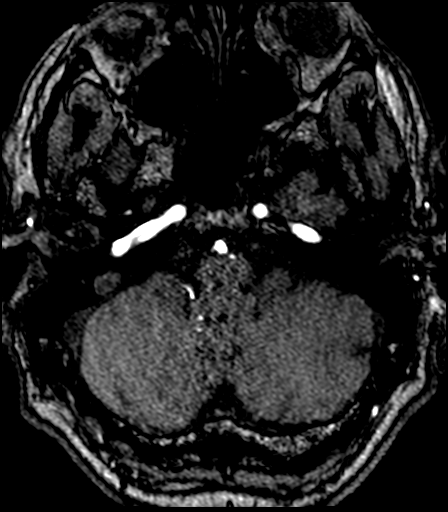
[im 70/156]
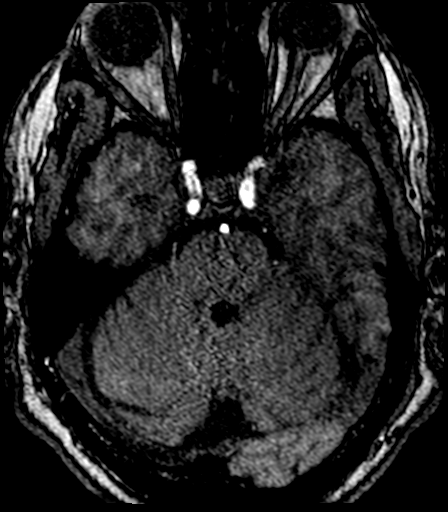
[im 80/156]
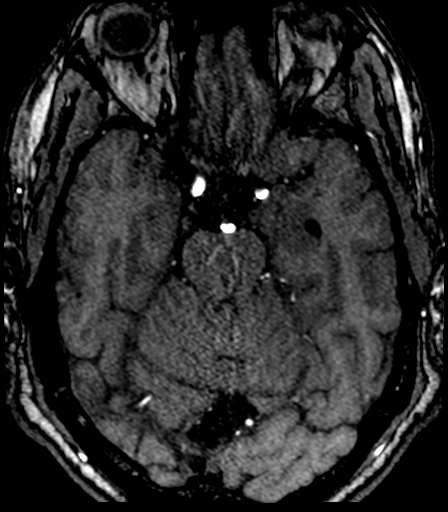
[im 90/156]
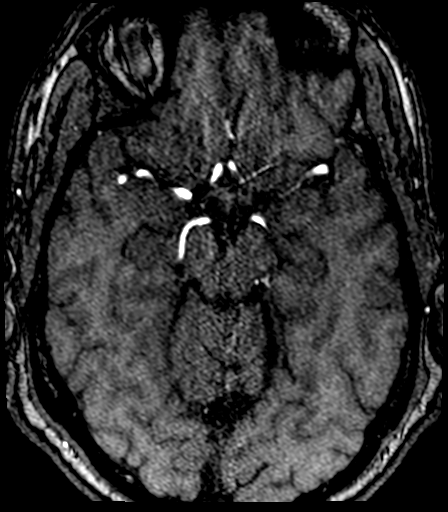
[im 109/156]
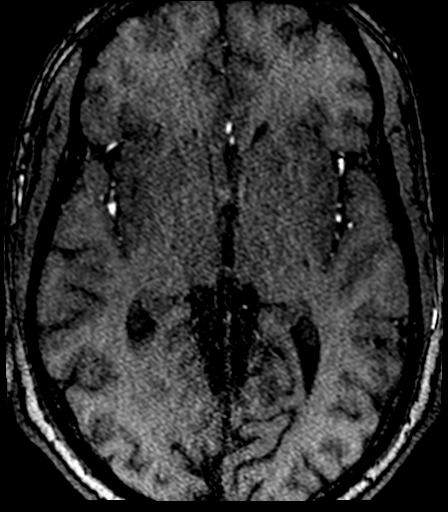
[im 129/156]
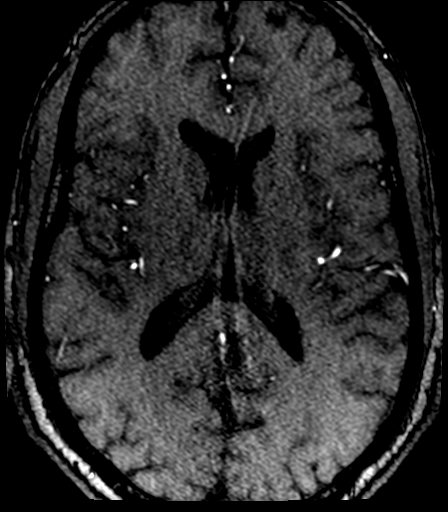
[im 132/156]
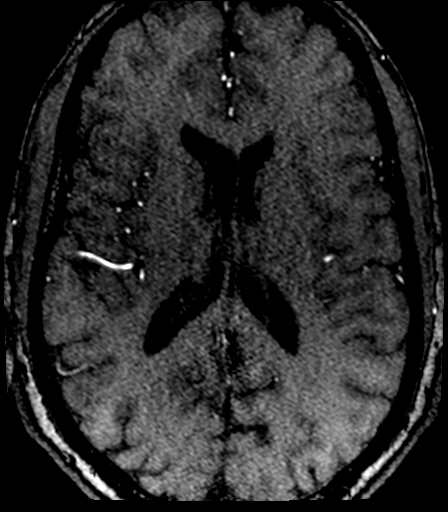
[im 149/156]
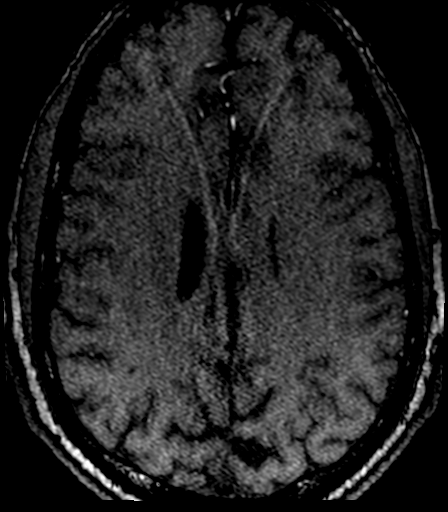

[23 of 48 positions shown; findings below may reference images not displayed]

FINDINGS: Anterior circulation:

The intracranial internal carotid arteries are patent. The M1 middle
cerebral arteries are patent. No M2 proximal branch occlusion or
high-grade proximal stenosis is identified. The anterior cerebral
arteries are patent. No intracranial aneurysm is identified.

Posterior circulation:

The intracranial vertebral arteries are patent. The basilar artery
is patent. The posterior cerebral arteries are patent. Posterior
communicating arteries are hypoplastic or absent bilaterally. No
intracranial aneurysm is identified.

Anatomic variants: As described.
IMPRESSION: No intracranial aneurysm is identified.

No intracranial large vessel occlusion or proximal high-grade
arterial stenosis.

## 2021-04-18 IMAGING — MR MR MRA NECK WO/W CM
3 series · 48 of 48 positions shown · IV contrast (multihance)
Comparison: Same-day MRI brain and MRA head [DATE].

CLINICAL DATA: Sudden onset of severe headache. Ocular migraine.
Family history of cerebral aneurysm. Carotid artery dissection,
follow-up. Additional history provided by scanning technologist:
Patient reports migraine headaches for 1 month.

EXAM:
MRA NECK WITHOUT AND WITH CONTRAST
TECHNIQUE: Multiplanar and multiecho pulse sequences of the neck were obtained
without and with intravenous contrast. Angiographic images of the
neck were obtained using MRA technique without and with intravenous
contrast.
CONTRAST:  20mL MULTIHANCE GADOBENATE DIMEGLUMINE 529 MG/ML IV SOLN

[Series 4: (id) · axial · 3.0mm · 0.98mm/px · z∈[-176,-77]mm · 11 of 50 slices shown]
[im 1/50]
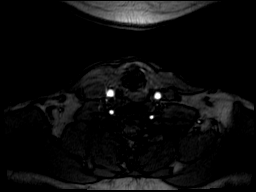
[im 5/50]
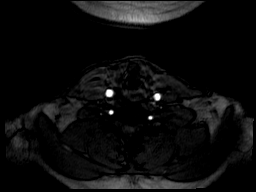
[im 10/50]
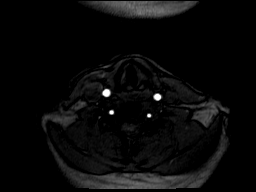
[im 15/50]
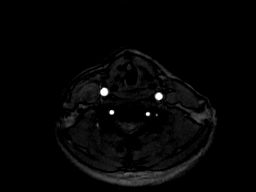
[im 20/50]
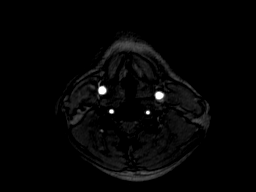
[im 25/50]
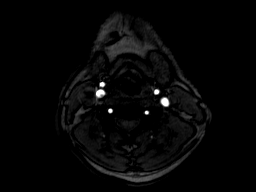
[im 30/50]
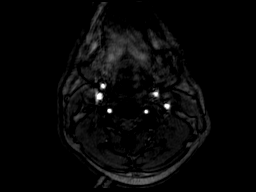
[im 35/50]
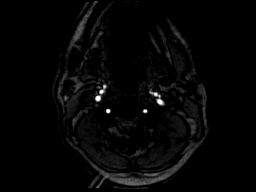
[im 40/50]
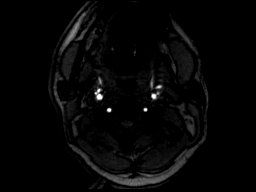
[im 45/50]
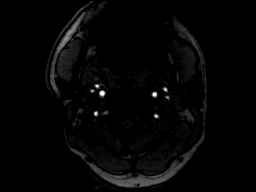
[im 50/50]
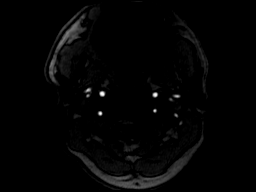

[Series 8: (id)_tt=1.0s · coronal · 0.8mm · 0.78mm/px · 19 of 80 slices shown (1 of 2)]
[im 1/80]
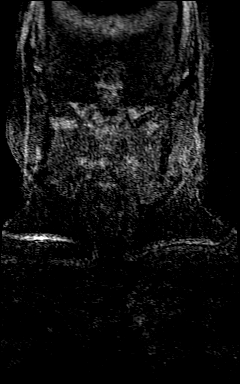
[im 5/80]
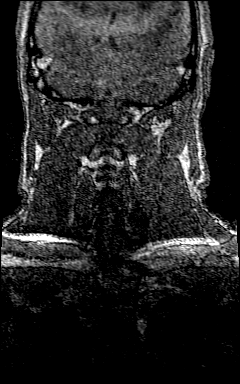
[im 9/80]
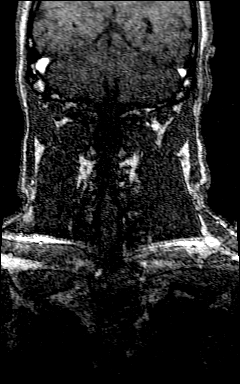
[im 14/80]
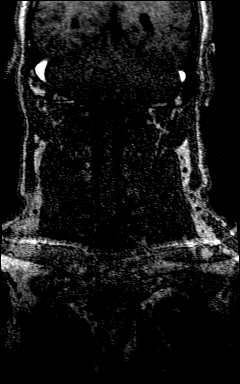
[im 18/80]
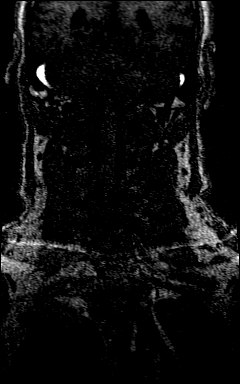
[im 22/80]
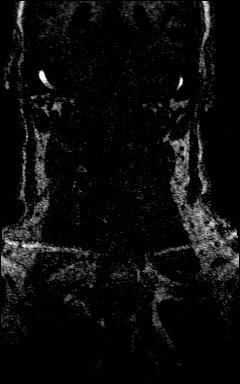
[im 27/80]
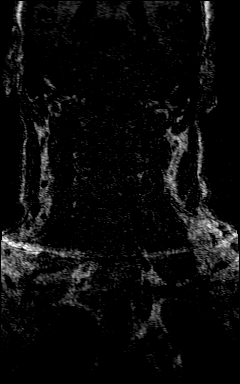
[im 31/80]
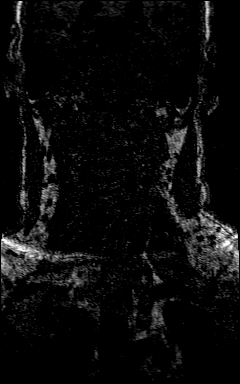
[im 36/80]
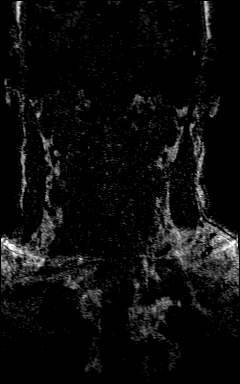
[im 40/80]
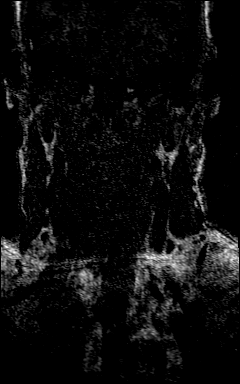
[im 44/80]
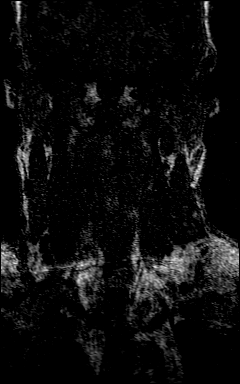
[im 49/80]
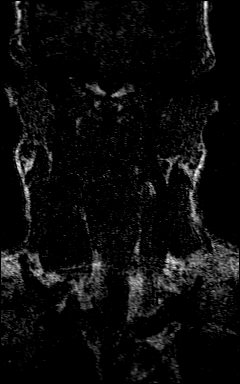
[im 53/80]
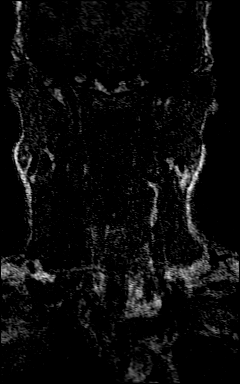
[im 58/80]
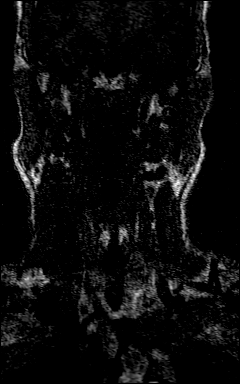
[im 62/80]
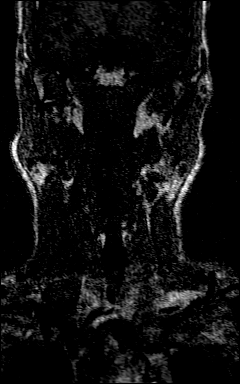
[im 66/80]
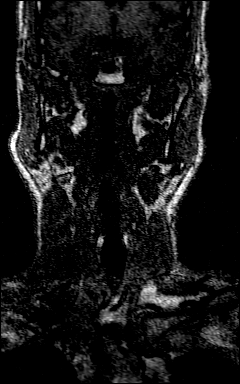
[im 71/80]
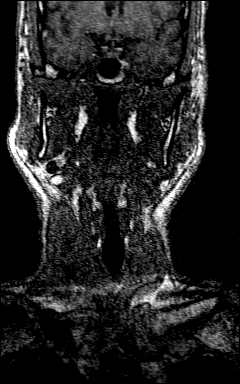
[im 75/80]
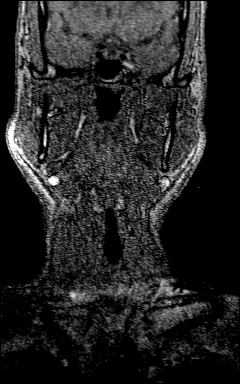
[im 80/80]
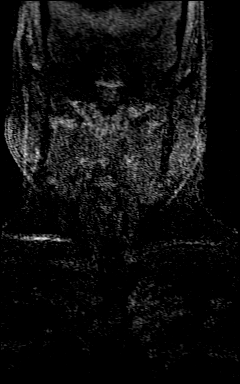

[Series 10: (id)_tt=1.0s · coronal · 0.8mm · 0.78mm/px · 18 of 75 slices shown (2 of 2)]
[im 1/75]
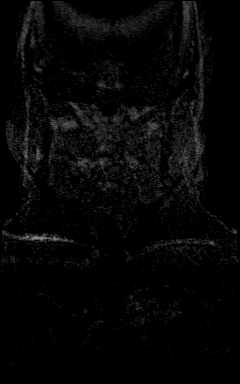
[im 5/75]
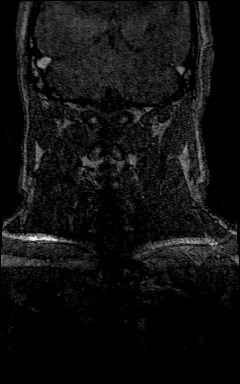
[im 9/75]
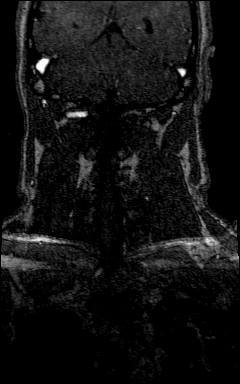
[im 14/75]
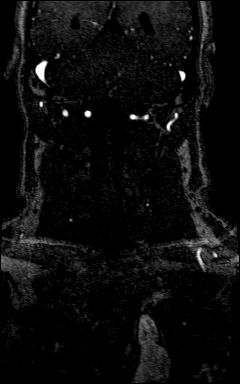
[im 18/75]
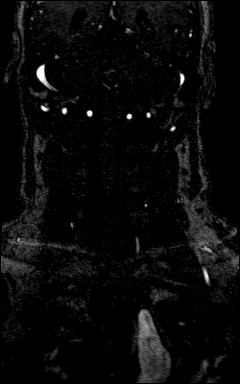
[im 22/75]
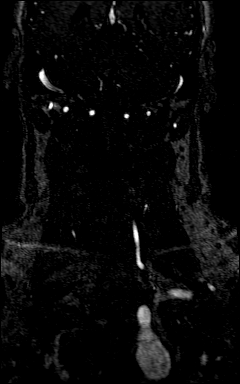
[im 27/75]
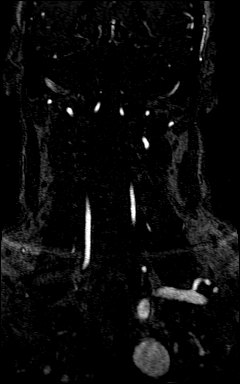
[im 31/75]
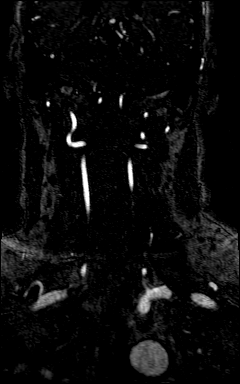
[im 35/75]
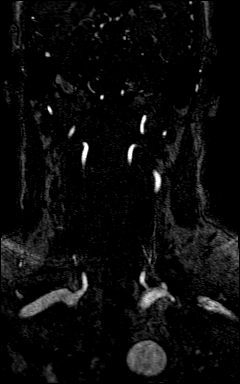
[im 40/75]
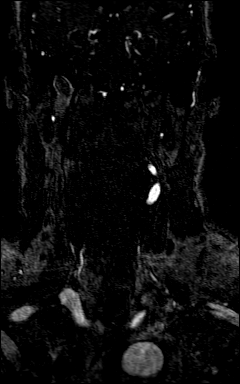
[im 44/75]
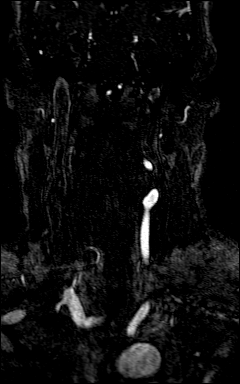
[im 48/75]
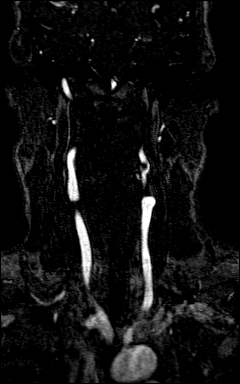
[im 53/75]
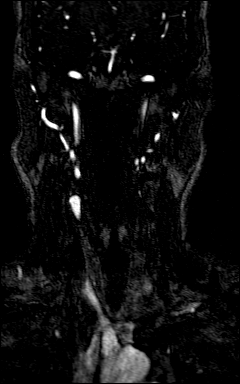
[im 57/75]
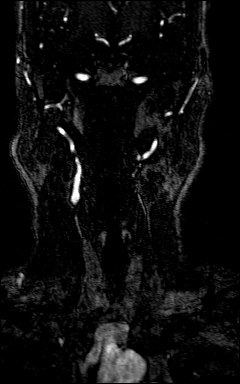
[im 61/75]
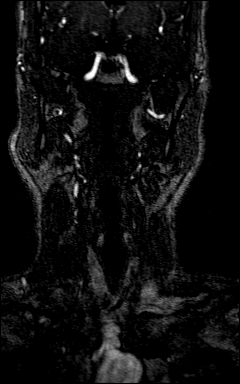
[im 66/75]
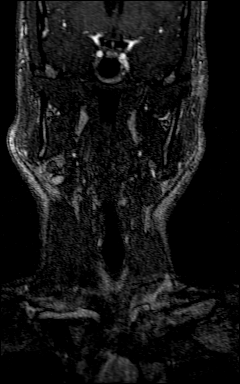
[im 70/75]
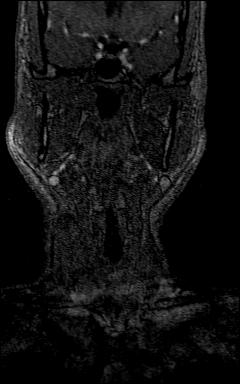
[im 75/75]
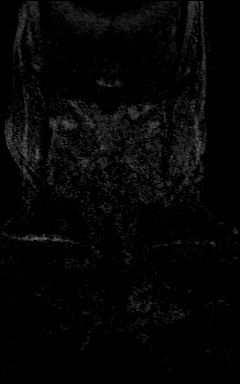

[48 of 48 positions shown; findings below may reference images not displayed]

FINDINGS: Standard aortic branching. No hemodynamically significant innominate
or proximal subclavian artery stenosis. The common carotid and
internal carotid arteries are patent within the neck without
appreciable stenosis. The vertebral arteries are codominant and
patent within the neck, without appreciable stenosis.
IMPRESSION: The common carotid, internal carotid and vertebral arteries are
patent within the neck without appreciable stenosis.

## 2021-04-18 MED ORDER — GADOBENATE DIMEGLUMINE 529 MG/ML IV SOLN
20.0000 mL | Freq: Once | INTRAVENOUS | Status: AC | PRN
  Administered 2021-04-18: 20 mL via INTRAVENOUS

## 2021-04-27 ENCOUNTER — Other Ambulatory Visit: Payer: Self-pay | Admitting: Family Medicine

## 2021-04-27 DIAGNOSIS — F429 Obsessive-compulsive disorder, unspecified: Secondary | ICD-10-CM

## 2021-06-14 NOTE — Progress Notes (Deleted)
NEUROLOGY FOLLOW UP OFFICE NOTE  Antonio House [id number]  Assessment/Plan:   *** Ocular migraines  Migraine prevention:  *** Migraine rescue:  *** Limit use of pain relievers to no more than 2 days out of week to prevent risk of rebound or medication-overuse headache. Keep headache diary Follow up ***   Subjective:  Antonio House is a 50 year old right-handed male with asthma who follows up for migraines.  UPDATE: MRI of brain with and without contrast and MRA head and neck on 04/18/2021 personally reviewed were unremarkable.  Nurtec or Ubrelvy ???? Intensity:  *** Duration:  *** Frequency:  *** Frequency of abortive medication: *** Current NSAIDS/analgesics:  ibuprofen 800mg  Current triptans:  none Current ergotamine:  none Current anti-emetic:  Zofran Current muscle relaxants:  none Current Antihypertensive medications:  none Current Antidepressant medications:  citalopram 40mg  QD Current Anticonvulsant medications:  none Current anti-CGRP:  none Current Vitamins/Herbal/Supplements:  none Current Antihistamines/Decongestants:  Flonase, Astelin Other therapy:  sleep Hormone/birth control:  none  Caffeine:  7-8 cups of coffee daily Diet:  Does not drink much water.  Does not skip meals. Exercise:  Yes.  Runs several times a week Depression:  yes; Anxiety:  yes.  Controlled. Other pain:  some knee pain when running Sleep:  Usually good but recently a little more insomnia.  Harder time falling asleep.  HISTORY:  History of ocular migraines for 20 years.  Sees a blind spot that develops a shimmering halo and gradually enlarges over 15 minutes before it resolves.  Sometimes may be followed by a dull diffuse headache.  Usually occurs once every 6 weeks (often associated with the resolution of a stressful period, such as finishing a work-related project).   Over the past 5 weeks, he developed a different headache.  The first one occurred out of sleep.  He  describes a severe mid-frontal throbbing/pounding headache associated with nausea, photophobia and phonophobia but no visual disturbance, numbness or weakness.  Worse when supine and improved sitting upright or standing.  He had a second episode 4-5 weeks later while awake.  No preceding visual aura.  800mg  ibuprofen helps.  He will go to sleep and when he wakes up a couple of hours later, it is resolved.  On the day of both events, he noted feeling weak and tired after going for a run.     He cannot recall any preceding trigger.  He did not recently finish a stressful project.       Past NSAIDS/analgesics:  none Past abortive triptans:  none Past abortive ergotamine:  none Past muscle relaxants:  none Past anti-emetic:  none Past antihypertensive medications:  none Past antidepressant medications: Zolft, Lexapro, Paxil, Prozac (hives) Past anticonvulsant medications:  none Past anti-CGRP:  none Past vitamins/Herbal/Supplements:  none Past antihistamines/decongestants:  none Other past therapies:  none      Family history of headache:  Mother (migraines), sister (migraine), niece (migraines).  Maternal grandmother had a hemorrhagic stroke.    PAST MEDICAL HISTORY: Past Medical History:  Diagnosis Date   Asthma    Hx of migraines    Occular   Hyperlipidemia     MEDICATIONS: Current Outpatient Medications on File Prior to Visit  Medication Sig Dispense Refill   albuterol (VENTOLIN HFA) 108 (90 Base) MCG/ACT inhaler Inhale 2 puffs into the lungs every 4 (four) hours as needed for wheezing or shortness of breath (cough). 18 g 1   azelastine (ASTELIN) 0.1 % nasal spray Place 2 sprays  into both nostrils 2 (two) times daily. Use in each nostril as directed 30 mL 5   Azelastine-Fluticasone 137-50 MCG/ACT SUSP Place 1 spray into the nose in the morning and at bedtime. 23 g 5   citalopram (CELEXA) 40 MG tablet TAKE 1 TABLET BY MOUTH EVERY DAY 90 tablet 3   fluticasone (FLONASE) 50 MCG/ACT  nasal spray Place 2 sprays into both nostrils daily. 16 g 5   fluticasone furoate-vilanterol (BREO ELLIPTA) 200-25 MCG/INH AEPB Inhale 1 puff into the lungs daily. Rinse mouth after each use. 60 each 5   ketoconazole (NIZORAL) 2 % shampoo APPLY TO SCALP EVERY DAY AS DIRECTED 120 mL 6   No current facility-administered medications on file prior to visit.    ALLERGIES: Allergies  Allergen Reactions   Prozac [Fluoxetine Hcl] Hives    FAMILY HISTORY: Family History  Problem Relation Age of Onset   Migraines Mother    Cancer Mother    Early death Mother    Hypertension Mother    COPD Father    Heart disease Father    Migraines Sister    Migraines Sister    Stroke Maternal Grandmother    Alcohol abuse Maternal Grandfather    Birth defects Maternal Grandfather    Early death Maternal Grandfather    Heart disease Paternal Grandmother    Stroke Paternal Grandfather       Objective:  *** General: No acute distress.  Patient appears ***-groomed.   Head:  Normocephalic/atraumatic Eyes:  Fundi examined but not visualized Neck: supple, no paraspinal tenderness, full range of motion Heart:  Regular rate and rhythm Lungs:  Clear to auscultation bilaterally Back: No paraspinal tenderness Neurological Exam: alert and oriented to person, place, and time.  Speech fluent and not dysarthric, language intact.  CN II-XII intact. Bulk and tone normal, muscle strength 5/5 throughout.  Sensation to light touch intact.  Deep tendon reflexes 2+ throughout, toes downgoing.  Finger to nose testing intact.  Gait normal, Romberg negative.   Metta Clines, DO  CC: ***

## 2021-06-15 ENCOUNTER — Ambulatory Visit: Payer: BC Managed Care – PPO | Admitting: Allergy

## 2021-06-15 ENCOUNTER — Ambulatory Visit: Payer: BC Managed Care – PPO | Admitting: Neurology

## 2021-06-29 ENCOUNTER — Ambulatory Visit: Payer: BC Managed Care – PPO | Admitting: Allergy

## 2021-07-28 ENCOUNTER — Telehealth: Payer: Self-pay | Admitting: Family Medicine

## 2021-09-19 NOTE — Telephone Encounter (Signed)
Opened in error

## 2021-09-23 ENCOUNTER — Other Ambulatory Visit: Payer: Self-pay | Admitting: Family Medicine

## 2021-09-23 DIAGNOSIS — J4520 Mild intermittent asthma, uncomplicated: Secondary | ICD-10-CM

## 2021-09-25 DIAGNOSIS — R509 Fever, unspecified: Secondary | ICD-10-CM | POA: Diagnosis not present

## 2021-09-25 DIAGNOSIS — J101 Influenza due to other identified influenza virus with other respiratory manifestations: Secondary | ICD-10-CM | POA: Diagnosis not present

## 2022-05-01 ENCOUNTER — Ambulatory Visit: Payer: BC Managed Care – PPO | Admitting: Allergy

## 2022-05-01 ENCOUNTER — Encounter: Payer: Self-pay | Admitting: Allergy

## 2022-05-01 VITALS — BP 116/78 | HR 60 | Temp 98.2°F | Resp 18 | Ht 75.0 in | Wt 239.5 lb

## 2022-05-01 DIAGNOSIS — J4540 Moderate persistent asthma, uncomplicated: Secondary | ICD-10-CM

## 2022-05-01 DIAGNOSIS — L219 Seborrheic dermatitis, unspecified: Secondary | ICD-10-CM | POA: Diagnosis not present

## 2022-05-01 DIAGNOSIS — R0982 Postnasal drip: Secondary | ICD-10-CM

## 2022-05-01 MED ORDER — RYALTRIS 665-25 MCG/ACT NA SUSP: 1.0000 | Freq: Two times a day (BID) | NASAL | 5 refills | Status: DC

## 2022-05-01 MED ORDER — KETOCONAZOLE 2 % EX SHAM: 1.0000 | MEDICATED_SHAMPOO | CUTANEOUS | 1 refills | Status: DC

## 2022-05-01 MED ORDER — ALBUTEROL SULFATE HFA 108 (90 BASE) MCG/ACT IN AERS: 2.0000 | INHALATION_SPRAY | RESPIRATORY_TRACT | 1 refills | Status: DC | PRN

## 2022-05-01 MED ORDER — FLUTICASONE FUROATE-VILANTEROL 200-25 MCG/ACT IN AEPB: 1.0000 | INHALATION_SPRAY | Freq: Every day | RESPIRATORY_TRACT | 5 refills | Status: DC

## 2022-05-01 NOTE — Assessment & Plan Note (Signed)
Past history - Postnasal drip and throat clearing for 15 years.  No specific triggers noted.  Tried Nasonex inconsistently and Protonix with unknown benefit.  No recent ENT evaluation.  Skin testing over 10 years ago was negative per patient report. Interim history - unchanged. Not sure if he tried dymista after last OV or not.   Start Ryaltris (olopatadine + mometasone nasal spray combination) 1-2 sprays per nostril twice a day. Sample given. Demonstrated proper use.   This replaces your other nasal sprays.  If this works well for you, then have [facility] ship the medication to your home - prescription already sent in.   Recommend environmental allergy testing in the future once asthma is better controlled.

## 2022-05-01 NOTE — Assessment & Plan Note (Signed)
>>  ASSESSMENT AND PLAN FOR POST-NASAL DRIP WRITTEN ON 05/01/2022  4:57 PM BY Garnet Sierras, DO  Past history - Postnasal drip and throat clearing for 15 years.  No specific triggers noted.  Tried Nasonex inconsistently and Protonix with unknown benefit.  No recent ENT evaluation.  Skin testing over 10 years ago was negative per patient report. Interim history - unchanged. Not sure if he tried dymista after last OV or not.   Start Ryaltris (olopatadine + mometasone nasal spray combination) 1-2 sprays per nostril twice a day. Sample given. Demonstrated proper use.   This replaces your other nasal sprays.  If this works well for you, then have [facility] ship the medication to your home - prescription already sent in.   Recommend environmental allergy testing in the future once asthma is better controlled.

## 2022-05-01 NOTE — Progress Notes (Signed)
Follow Up Note  RE: Antonio House MRN: [id number] DOB: [birth year] Date of Office Visit: 05/01/2022  Referring provider: Martinique, Betty G, MD Primary care provider: Martinique, Betty G, MD  Chief Complaint: Asthma (Has been using inhaler sparingly to make it last. Has been having trouble breathing, little cough, wheeze some. Uses his alb.inh before physical activity and has used it 1x I the past couple of weeks ) and Other (Has been having skin issues. Skin has been dry and flaky )  History of Present Illness: I had the pleasure of seeing Antonio House for a follow up visit at the [facility] on 05/02/2022. He is a 51 y.o. male, who is being followed for asthma and PND. His previous allergy office visit was on 02/09/2021 with Dr. Maudie Mercury. Today is a regular follow up visit. Failed to follow up as recommended.   Asthma Patient was using Breo daily initially which helped with his breathing and his exercising but has been only taking it sporadically for "awhile" now and noticed wheezing, chest tightness. Symptoms have been worsening which prompted him to come in today to the office.   Patient has not been using the rescue inhaler for these episodes but he does premedicate with albuterol 2 puffs prior to exertion with good benefit - runs 2.5 miles each time a few times per week.   Denies any ER/urgent care visits or prednisone use since the last visit.  Post-nasal drip/throat clearing Patient is not sure if he tried dymista after the last visit. Willing to try nasal sprays now.  Denies reflux symptoms.  Rash  Patient used to follow with dermatology for seborrheic dermatitis. Now noticed issues with his scalp again but ran out of ketoconazole shampoo.   Assessment and Plan: Antonio House is a 51 y.o. male with: Not well controlled moderate persistent asthma Past history - Diagnosed with asthma over 40 years ago. Tried Advair and Symbicort.  Main triggers are unknown but  exertion makes things worse.  2020 spirometry showed mild obstruction with 8% improvement in FEV1 post bronchodilator treatment.  Clinically also feeling better. Interim history - non compliant with inhaler but does notice improvement if taking Breo daily. Today's spirometry showed: moderate obstructive disease with 6% and greater than 200cc improvement in FEV1 post bronchodilator treatment. Clinically feeling improved.  Daily controller medication(s): RESTART Breo 234mg 1 puff daily. Rinse mouth after each use.  Prior to physical activity: May use albuterol rescue inhaler 2 puffs 5 to 15 minutes prior to strenuous physical activities. Rescue medications: May use albuterol rescue inhaler 2 puffs every 4 to 6 hours as needed for shortness of breath, chest tightness, coughing, and wheezing. Monitor frequency of use.  Get spirometry at next visit.  Post-nasal drip Past history - Postnasal drip and throat clearing for 15 years.  No specific triggers noted.  Tried Nasonex inconsistently and Protonix with unknown benefit.  No recent ENT evaluation.  Skin testing over 10 years ago was negative per patient report. Interim history - unchanged. Not sure if he tried dymista after last OV or not.  Start Ryaltris (olopatadine + mometasone nasal spray combination) 1-2 sprays per nostril twice a day. Sample given. Demonstrated proper use.  This replaces your other nasal sprays. If this works well for you, then have [facility] ship the medication to your home - prescription already sent in.  Recommend environmental allergy testing in the future once asthma is better controlled.   Seborrheic dermatitis Used to see dermatology for  this in the past and used ketoconazole shampoo with good benefit but ran out. May use ketoconazole shampoo twice per week and then use as needed during flares.  If no improvement recommend seeing dermatology again.  Return in about 4 months (around 09/01/2022).  Meds ordered this  encounter  Medications   ketoconazole (NIZORAL) 2 % shampoo    Sig: Apply 1 Application topically 2 (two) times a week. Until it clears up and then may use as needed.    Dispense:  120 mL    Refill:  1   albuterol (VENTOLIN HFA) 108 (90 Base) MCG/ACT inhaler    Sig: Inhale 2 puffs into the lungs every 4 (four) hours as needed for wheezing or shortness of breath (cough).    Dispense:  18 g    Refill:  1   fluticasone furoate-vilanterol (BREO ELLIPTA) 200-25 MCG/ACT AEPB    Sig: Inhale 1 puff into the lungs daily. Rinse mouth after each use.    Dispense:  60 each    Refill:  5   Olopatadine-Mometasone (RYALTRIS) 665-25 MCG/ACT SUSP    Sig: Place 1-2 sprays into the nose in the morning and at bedtime.    Dispense:  29 g    Refill:  5    [phone]   Lab Orders  No laboratory test(s) ordered today    Diagnostics: Spirometry:  Tracings reviewed. His effort: Good reproducible efforts. FVC: 5.05L FEV1: 2.94L, 64% predicted FEV1/FVC ratio: 58% Interpretation: Spirometry consistent with moderate obstructive disease with 6% and greater than 200cc improvement in FEV1 post bronchodilator treatment. Clinically feeling improved.   Please see scanned spirometry results for details.  Medication List:  Current Outpatient Medications  Medication Sig Dispense Refill   albuterol (VENTOLIN HFA) 108 (90 Base) MCG/ACT inhaler Inhale 2 puffs into the lungs every 4 (four) hours as needed for wheezing or shortness of breath (cough). 18 g 1   citalopram (CELEXA) 40 MG tablet TAKE 1 TABLET BY MOUTH EVERY DAY 90 tablet 3   fluticasone furoate-vilanterol (BREO ELLIPTA) 200-25 MCG/ACT AEPB Inhale 1 puff into the lungs daily. Rinse mouth after each use. 60 each 5   [START ON 05/03/2022] ketoconazole (NIZORAL) 2 % shampoo Apply 1 Application topically 2 (two) times a week. Until it clears up and then may use as needed. 120 mL 1   Olopatadine-Mometasone (RYALTRIS) 665-25 MCG/ACT SUSP Place 1-2 sprays into  the nose in the morning and at bedtime. 29 g 5   No current facility-administered medications for this visit.   Allergies: Allergies  Allergen Reactions   Prozac [Fluoxetine Hcl] Hives   I reviewed his past medical history, social history, family history, and environmental history and no significant changes have been reported from his previous visit.  Review of Systems  Constitutional:  Negative for appetite change, chills, fever and unexpected weight change.  HENT:  Positive for postnasal drip. Negative for congestion and rhinorrhea.   Eyes:  Negative for itching.  Respiratory:  Positive for cough, chest tightness and wheezing. Negative for shortness of breath.   Cardiovascular:  Negative for chest pain.  Gastrointestinal:  Negative for abdominal pain.  Genitourinary:  Negative for difficulty urinating.  Skin:  Positive for rash.  Allergic/Immunologic: Negative for food allergies.  Neurological:  Negative for headaches.    Objective: BP 116/78   Pulse 60   Temp 98.2 F (36.8 C)   Resp 18   Ht '6\' 3"'$  (1.905 m)   Wt 239 lb 8 oz (108.6 kg)  SpO2 96%   BMI 29.94 kg/m  Body mass index is 29.94 kg/m. Physical Exam Vitals and nursing note reviewed.  Constitutional:      Appearance: Normal appearance. He is well-developed.  HENT:     Head: Normocephalic and atraumatic.     Right Ear: Tympanic membrane and external ear normal.     Left Ear: Tympanic membrane and external ear normal.     Nose: Nose normal.  Eyes:     Conjunctiva/sclera: Conjunctivae normal.  Cardiovascular:     Rate and Rhythm: Normal rate and regular rhythm.     Heart sounds: Normal heart sounds. No murmur heard.    No friction rub. No gallop.  Pulmonary:     Effort: Pulmonary effort is normal.     Breath sounds: Normal breath sounds. No wheezing or rales.  Musculoskeletal:     Cervical back: Neck supple.  Skin:    General: Skin is warm.     Findings: No rash.  Neurological:     Mental Status: He  is alert and oriented to person, place, and time.  Psychiatric:        Behavior: Behavior normal.    Previous notes and tests were reviewed. The plan was reviewed with the patient/family, and all questions/concerned were addressed.  It was my pleasure to see Antonio House today and participate in his care. Please feel free to contact me with any questions or concerns.  Sincerely,  Rexene Alberts, DO Allergy & Immunology  [facility] office: [place] office: [phone]

## 2022-05-01 NOTE — Patient Instructions (Addendum)
Asthma: Daily controller medication(s): RESTART Breo 246mg 1 puff daily. Rinse mouth after each use.  Prior to physical activity: May use albuterol rescue inhaler 2 puffs 5 to 15 minutes prior to strenuous physical activities. Rescue medications: May use albuterol rescue inhaler 2 puffs every 4 to 6 hours as needed for shortness of breath, chest tightness, coughing, and wheezing. Monitor frequency of use.  Asthma control goals:  Full participation in all desired activities (may need albuterol before activity) Albuterol use two times or less a week on average (not counting use with activity) Cough interfering with sleep two times or less a month Oral steroids no more than once a year No hospitalizations  Rhinitis/throat clearing: Start Ryaltris (olopatadine + mometasone nasal spray combination) 1-2 sprays per nostril twice a day. Sample given. This replaces your other nasal sprays. If this works well for you, then have [facility] ship the medication to your home - prescription already sent in.   Recommend environmental allergy testing in the future once asthma is better controlled.   Rash/scalp May use ketoconazole shampoo twice per week and then use as needed during flares.  If no improvement recommend seeing dermatology again.  Follow up in 4 months or sooner if needed.

## 2022-05-01 NOTE — Assessment & Plan Note (Signed)
Past history - Diagnosed with asthma over 40 years ago. Tried Advair and Symbicort.  Main triggers are unknown but exertion makes things worse.  2020 spirometry showed mild obstruction with 8% improvement in FEV1 post bronchodilator treatment.  Clinically also feeling better. Interim history - non compliant with inhaler but does notice improvement if taking Breo daily.  Today's spirometry showed: moderate obstructive disease with 6% and greater than 200cc improvement in FEV1 post bronchodilator treatment. Clinically feeling improved.  . Daily controller medication(s): RESTART Breo 290mg 1 puff daily. Rinse mouth after each use.  . Prior to physical activity: May use albuterol rescue inhaler 2 puffs 5 to 15 minutes prior to strenuous physical activities. .Marland KitchenRescue medications: May use albuterol rescue inhaler 2 puffs every 4 to 6 hours as needed for shortness of breath, chest tightness, coughing, and wheezing. Monitor frequency of use.  . Get spirometry at next visit.

## 2022-05-01 NOTE — Assessment & Plan Note (Signed)
Used to see dermatology for this in the past and used ketoconazole shampoo with good benefit but ran out. . May use ketoconazole shampoo twice per week and then use as needed during flares.  o If no improvement recommend seeing dermatology again.

## 2022-05-02 ENCOUNTER — Encounter: Payer: Self-pay | Admitting: Allergy

## 2022-05-04 ENCOUNTER — Other Ambulatory Visit: Payer: Self-pay

## 2022-05-04 MED ORDER — RYALTRIS 665-25 MCG/ACT NA SUSP: 1.0000 | Freq: Two times a day (BID) | NASAL | 5 refills | Status: DC

## 2022-05-15 NOTE — Progress Notes (Unsigned)
HPI: [name] is a 51 y.o.male with medical hx significant for OCD,migraine,HLD, and asthma here today for his routine physical examination.  Last CPE: 01/15/2018. Last follow up visit 03/2021.  He reports no new problems since the last visit.   He has been trying to exercise regularly, running a few times per week. He reports eating a reasonably healthy diet, including daily vegetables and not much red meat.  He visits his eye doctor regularly and sleeps an average of seven hours per night.  Immunization History  Administered Date(s) Administered   Influenza,inj,Quad PF,6+ Mos 04/10/2021, 05/16/2022   Tdap 01/15/2018   Zoster Recombinat (Shingrix) 05/16/2022   Health Maintenance  Topic Date Due   COVID-19 Vaccine (1) Never done   COLONOSCOPY (Pts 45-60yr Insurance coverage will need to be confirmed)  Never done   HIV Screening  01/16/2023 (Originally 04/15/1986)   Zoster Vaccines- Shingrix (2 of 2) 07/11/2022   TETANUS/TDAP  01/16/2028   INFLUENZA VACCINE  Completed   Hepatitis C Screening  Completed   HPV VACCINES  Aged Out   Last prostate ca screening:  No nocturia or changes in urinary frequency. Lab Results  Component Value Date   PSA 1.80 04/10/2021   -Negative for high alcohol intake or tobacco use.  HLD on non pharmacologic treatment. Lab Results  Component Value Date   CHOL 235 (H) 04/10/2021   HDL 44.50 04/10/2021   LDLCALC 157 (H) 04/10/2021   TRIG 167.0 (H) 04/10/2021   CHOLHDL 5 04/10/2021   Seborrheic dermatitis; He needs refill of ketoconazole shampoo, medication helps with scalp pruritus.   He is currently taking citalopram 40 mg daily for OCD and anxiety and states that it seems to do a reasonable job, better than before taking medication. He still feels the need to take it.  Ocular migraine and headache: He experiences occasional headaches, approximately once every couple of months.Follows with Dr JTomi Likens neuro. He sees Dr. KMaudie Mercury  immunologist, for seasonal allergies. A couple days ago he injured right ear when cleaning it with a Qtip, bled some. Mild soreness, improving,  Review of Systems  Constitutional:  Negative for activity change, appetite change and fever.  HENT:  Negative for mouth sores, nosebleeds, sore throat and trouble swallowing.   Eyes:  Negative for redness and visual disturbance.  Respiratory:  Negative for cough, shortness of breath and wheezing.   Cardiovascular:  Negative for chest pain, palpitations and leg swelling.  Gastrointestinal:  Negative for abdominal pain, blood in stool, nausea and vomiting.  Endocrine: Negative for cold intolerance, heat intolerance, polydipsia, polyphagia and polyuria.  Genitourinary:  Negative for decreased urine volume, dysuria, genital sores, hematuria and testicular pain.  Musculoskeletal:  Negative for gait problem and myalgias.  Skin:  Negative for color change and rash.  Allergic/Immunologic: Positive for environmental allergies.  Neurological:  Positive for headaches. Negative for syncope, weakness and numbness.  Hematological:  Negative for adenopathy. Does not bruise/bleed easily.  Psychiatric/Behavioral:  Negative for confusion and sleep disturbance. The patient is nervous/anxious.   All other systems reviewed and are negative.  Current Outpatient Medications on File Prior to Visit  Medication Sig Dispense Refill   albuterol (VENTOLIN HFA) 108 (90 Base) MCG/ACT inhaler Inhale 2 puffs into the lungs every 4 (four) hours as needed for wheezing or shortness of breath (cough). 18 g 1   fluticasone furoate-vilanterol (BREO ELLIPTA) 200-25 MCG/ACT AEPB Inhale 1 puff into the lungs daily. Rinse mouth after each use. 60 each 5  Olopatadine-Mometasone (RYALTRIS) [id number] MCG/ACT SUSP Place 1-2 sprays into the nose in the morning and at bedtime. 29 g 5   No current facility-administered medications on file prior to visit.   Past Medical History:  Diagnosis Date    Asthma    Hx of migraines    Occular   Hyperlipidemia    Past Surgical History:  Procedure Laterality Date   WISDOM TOOTH EXTRACTION     Allergies  Allergen Reactions   Prozac [Fluoxetine Hcl] Hives   Family History  Problem Relation Age of Onset   Migraines Mother    Cancer Mother    Early death Mother    Hypertension Mother    COPD Father    Heart disease Father    Migraines Sister    Migraines Sister    Stroke Maternal Grandmother    Alcohol abuse Maternal Grandfather    Birth defects Maternal Grandfather    Early death Maternal Grandfather    Heart disease Paternal Grandmother    Stroke Paternal Grandfather     Social History   Socioeconomic History   Marital status: Married    Spouse name: Not on file   Number of children: Not on file   Years of education: Not on file   Highest education level: Not on file  Occupational History   Not on file  Tobacco Use   Smoking status: Never   Smokeless tobacco: Never  Vaping Use   Vaping Use: Never used  Substance and Sexual Activity   Alcohol use: Yes   Drug use: Never   Sexual activity: Yes    Partners: Female  Other Topics Concern   Not on file  Social History Narrative   Not on file   Social Determinants of Health   Financial Resource Strain: Not on file  Food Insecurity: Not on file  Transportation Needs: Not on file  Physical Activity: Not on file  Stress: Not on file  Social Connections: Not on file   Vitals:   05/16/22 1055  BP: 124/80  Pulse: 60  Resp: 12  Temp: 98.1 F (36.7 C)  SpO2: 97%  Body mass index is 29.89 kg/m. Wt Readings from Last 3 Encounters:  05/16/22 239 lb 2 oz (108.5 kg)  05/01/22 239 lb 8 oz (108.6 kg)  04/10/21 242 lb 8 oz (110 kg)  Physical Exam Vitals and nursing note reviewed.  Constitutional:      General: He is not in acute distress.    Appearance: He is well-developed.  HENT:     Head: Normocephalic and atraumatic.     Right Ear: Tympanic membrane and  external ear normal.     Left Ear: Tympanic membrane, ear canal and external ear normal.     Ears:     Comments: Small excoriation right ear canal,dry blood, no active bleeding.No edema.    Mouth/Throat:     Mouth: Mucous membranes are moist.     Pharynx: Oropharynx is clear.  Eyes:     Extraocular Movements: Extraocular movements intact.     Conjunctiva/sclera: Conjunctivae normal.     Pupils: Pupils are equal, round, and reactive to light.  Neck:     Thyroid: No thyromegaly.     Trachea: No tracheal deviation.  Cardiovascular:     Rate and Rhythm: Normal rate and regular rhythm.     Pulses:          Dorsalis pedis pulses are 2+ on the right side and 2+ on the left side.  Heart sounds: No murmur heard. Pulmonary:     Effort: Pulmonary effort is normal. No respiratory distress.     Breath sounds: Normal breath sounds.  Abdominal:     Palpations: Abdomen is soft. There is no hepatomegaly or mass.     Tenderness: There is no abdominal tenderness.  Genitourinary:    Comments: No concerns. Musculoskeletal:        General: No tenderness.     Cervical back: Normal range of motion.     Comments: No major deformities appreciated and no signs of synovitis.  Lymphadenopathy:     Cervical: No cervical adenopathy.     Upper Body:     Right upper body: No supraclavicular adenopathy.     Left upper body: No supraclavicular adenopathy.  Skin:    General: Skin is warm.     Findings: No erythema.  Neurological:     General: No focal deficit present.     Mental Status: He is alert and oriented to person, place, and time.     Cranial Nerves: No cranial nerve deficit.     Sensory: No sensory deficit.     Gait: Gait normal.     Deep Tendon Reflexes:     Reflex Scores:      Bicep reflexes are 2+ on the right side and 2+ on the left side.      Patellar reflexes are 2+ on the right side and 2+ on the left side. Psychiatric:        Mood and Affect: Mood and affect normal.   ASSESSMENT  AND PLAN:  [name] was seen today for annual exam.  Diagnoses and all orders for this visit: Orders Placed This Encounter  Procedures   Flu Vaccine QUAD 73moIM (Fluarix, Fluzone & Alfiuria Quad PF)   Varicella-zoster vaccine IM   Basic metabolic panel   Lipid panel   PSA(Must document that pt has been informed of limitations of PSA testing.)   Hemoglobin A1c   Ambulatory referral to Gastroenterology   Lab Results  Component Value Date   CREATININE 1.09 05/16/2022   BUN 11 05/16/2022   NA 138 05/16/2022   K 4.2 05/16/2022   CL 105 05/16/2022   CO2 27 05/16/2022   Lab Results  Component Value Date   CHOL 204 (H) 05/16/2022   HDL 48.20 05/16/2022   LDLCALC 131 (H) 05/16/2022   TRIG 124.0 05/16/2022   CHOLHDL 4 05/16/2022   Lab Results  Component Value Date   HGBA1C 5.7 05/16/2022   Lab Results  Component Value Date   CREATININE 1.09 05/16/2022   BUN 11 05/16/2022   NA 138 05/16/2022   K 4.2 05/16/2022   CL 105 05/16/2022   CO2 27 05/16/2022   Lab Results  Component Value Date   PSA 0.47 05/16/2022   PSA 1.80 04/10/2021   Routine general medical examination at a health care facility We discussed the importance of regular physical activity and healthy diet for prevention of chronic illness and/or complications. Preventive guidelines reviewed. Discussed recommendation is regard to PSA. Vaccination updated. Next CPE in a year. The 10-year ASCVD risk score (Arnett DK, et al., 2019) is: 3.9%   Values used to calculate the score:     Age: [age]years     Sex: Male     Is Non-Hispanic African American: No     Diabetic: No     Tobacco smoker: No     Systolic Blood Pressure: 1725mmHg     Is  BP treated: No     HDL Cholesterol: 48.2 mg/dL     Total Cholesterol: 204 mg/dL  Obsessive-compulsive disorder, unspecified type Problem is stable and adequately controlled with Celexa, so no changes.  -     citalopram (CELEXA) 40 MG tablet; TAKE 1 TABLET BY MOUTH EVERY  DAY  Prostate cancer screening -     PSA(Must document that pt has been informed of limitations of PSA testing.)  Hyperlipidemia, unspecified hyperlipidemia type Non pharmacologic treatment to continue for now. Further recommendations will be given according to 10 years CVD risk score and lipid panel numbers.  Colon cancer screening -     Ambulatory referral to Gastroenterology  Screening for endocrine, metabolic and immunity disorder -     Hemoglobin A1c -     Basic metabolic panel  Need for influenza vaccination -     Flu Vaccine QUAD 23moIM (Fluarix, Fluzone & Alfiuria Quad PF)  Need for shingles vaccine -     Varicella-zoster vaccine IM  Seborrheic dermatitis Problem is stable. Continue Nizoral shampoo daily prn.  -     ketoconazole (NIZORAL) 2 % shampoo; Apply 1 Application topically 2 (two) times a week. Until it clears up and then may use as needed.  Return in 1 year (on 05/17/2023) for CPE.  Jentry Warnell G. JMartinique MD  [facility].

## 2022-05-16 ENCOUNTER — Ambulatory Visit (INDEPENDENT_AMBULATORY_CARE_PROVIDER_SITE_OTHER): Payer: BC Managed Care – PPO | Admitting: Family Medicine

## 2022-05-16 ENCOUNTER — Encounter: Payer: Self-pay | Admitting: Family Medicine

## 2022-05-16 VITALS — BP 124/80 | HR 60 | Temp 98.1°F | Resp 12 | Ht 75.0 in | Wt 239.1 lb

## 2022-05-16 DIAGNOSIS — Z130 Encounter for screening for diseases of the blood and blood-forming organs and certain disorders involving the immune mechanism: Secondary | ICD-10-CM

## 2022-05-16 DIAGNOSIS — Z13228 Encounter for screening for other metabolic disorders: Secondary | ICD-10-CM

## 2022-05-16 DIAGNOSIS — Z23 Encounter for immunization: Secondary | ICD-10-CM

## 2022-05-16 DIAGNOSIS — F429 Obsessive-compulsive disorder, unspecified: Secondary | ICD-10-CM | POA: Diagnosis not present

## 2022-05-16 DIAGNOSIS — Z1329 Encounter for screening for other suspected endocrine disorder: Secondary | ICD-10-CM | POA: Diagnosis not present

## 2022-05-16 DIAGNOSIS — Z1211 Encounter for screening for malignant neoplasm of colon: Secondary | ICD-10-CM

## 2022-05-16 DIAGNOSIS — Z0000 Encounter for general adult medical examination without abnormal findings: Secondary | ICD-10-CM

## 2022-05-16 DIAGNOSIS — Z125 Encounter for screening for malignant neoplasm of prostate: Secondary | ICD-10-CM

## 2022-05-16 DIAGNOSIS — E785 Hyperlipidemia, unspecified: Secondary | ICD-10-CM | POA: Diagnosis not present

## 2022-05-16 DIAGNOSIS — L219 Seborrheic dermatitis, unspecified: Secondary | ICD-10-CM

## 2022-05-16 LAB — BASIC METABOLIC PANEL
BUN: 11 mg/dL (ref 6–23)
CO2: 27 mEq/L (ref 19–32)
Calcium: 9.5 mg/dL (ref 8.4–10.5)
Chloride: 105 mEq/L (ref 96–112)
Creatinine, Ser: 1.09 mg/dL (ref 0.40–1.50)
GFR: 78.82 mL/min (ref >60.00)
Glucose, Bld: 85 mg/dL (ref 70–99)
Potassium: 4.2 mEq/L (ref 3.5–5.1)
Sodium: 138 mEq/L (ref 135–145)

## 2022-05-16 LAB — LIPID PANEL
Cholesterol: 204 mg/dL — ABNORMAL HIGH (ref 0–200)
HDL: 48.2 mg/dL (ref >39.00)
LDL Cholesterol: 131 mg/dL — ABNORMAL HIGH (ref 0–99)
NonHDL: 155.7
Total CHOL/HDL Ratio: 4
Triglycerides: 124 mg/dL (ref 0.0–149.0)
VLDL: 24.8 mg/dL (ref 0.0–40.0)

## 2022-05-16 LAB — PSA: PSA: 0.47 ng/mL (ref 0.10–4.00)

## 2022-05-16 LAB — HEMOGLOBIN A1C: Hgb A1c MFr Bld: 5.7 % (ref 4.6–6.5)

## 2022-05-16 MED ORDER — CITALOPRAM HYDROBROMIDE 40 MG PO TABS: ORAL_TABLET | ORAL | 3 refills | Status: DC

## 2022-05-16 MED ORDER — KETOCONAZOLE 2 % EX SHAM: 1.0000 | MEDICATED_SHAMPOO | CUTANEOUS | 2 refills | Status: DC

## 2022-05-16 NOTE — Patient Instructions (Addendum)
A few things to remember from today's visit:   Routine general medical examination at a health care facility  Obsessive-compulsive disorder, unspecified type - Plan: citalopram (CELEXA) 40 MG tablet  Prostate cancer screening - Plan: PSA(Must document that pt has been informed of limitations of PSA testing.)  Hyperlipidemia, unspecified hyperlipidemia type - Plan: Lipid panel  Colon cancer screening - Plan: Ambulatory referral to Gastroenterology  Screening for endocrine, metabolic and immunity disorder - Plan: Basic metabolic panel, Hemoglobin A1c  No changes today.  If you need refills for medications you take chronically, please call your pharmacy. Do not use My Chart to request refills or for acute issues that need immediate attention. If you send a my chart message, it may take a few days to be addressed, specially if I am not in the office.  Please be sure medication list is accurate. If a new problem present, please set up appointment sooner than planned today. Health Maintenance, Male Adopting a healthy lifestyle and getting preventive care are important in promoting health and wellness. Ask your health care provider about: The right schedule for you to have regular tests and exams. Things you can do on your own to prevent diseases and keep yourself healthy. What should I know about diet, weight, and exercise? Eat a healthy diet  Eat a diet that includes plenty of vegetables, fruits, low-fat dairy products, and lean protein. Do not eat a lot of foods that are high in solid fats, added sugars, or sodium. Maintain a healthy weight Body mass index (BMI) is a measurement that can be used to identify possible weight problems. It estimates body fat based on height and weight. Your health care provider can help determine your BMI and help you achieve or maintain a healthy weight. Get regular exercise Get regular exercise. This is one of the most important things you can do for  your health. Most adults should: Exercise for at least 150 minutes each week. The exercise should increase your heart rate and make you sweat (moderate-intensity exercise). Do strengthening exercises at least twice a week. This is in addition to the moderate-intensity exercise. Spend less time sitting. Even light physical activity can be beneficial. Watch cholesterol and blood lipids Have your blood tested for lipids and cholesterol at 51 years of age, then have this test every 5 years. You may need to have your cholesterol levels checked more often if: Your lipid or cholesterol levels are high. You are older than 51 years of age. You are at high risk for heart disease. What should I know about cancer screening? Many types of cancers can be detected early and may often be prevented. Depending on your health history and family history, you may need to have cancer screening at various ages. This may include screening for: Colorectal cancer. Prostate cancer. Skin cancer. Lung cancer. What should I know about heart disease, diabetes, and high blood pressure? Blood pressure and heart disease High blood pressure causes heart disease and increases the risk of stroke. This is more likely to develop in people who have high blood pressure readings or are overweight. Talk with your health care provider about your target blood pressure readings. Have your blood pressure checked: Every 3-5 years if you are 7-78 years of age. Every year if you are 81 years old or older. If you are between the ages of 8 and 20 and are a current or former smoker, ask your health care provider if you should have a one-time screening for  abdominal aortic aneurysm (AAA). Diabetes Have regular diabetes screenings. This checks your fasting blood sugar level. Have the screening done: Once every three years after age 16 if you are at a normal weight and have a low risk for diabetes. More often and at a younger age if you are  overweight or have a high risk for diabetes. What should I know about preventing infection? Hepatitis B If you have a higher risk for hepatitis B, you should be screened for this virus. Talk with your health care provider to find out if you are at risk for hepatitis B infection. Hepatitis C Blood testing is recommended for: Everyone born from [birth year] through 1965. Anyone with known risk factors for hepatitis C. Sexually transmitted infections (STIs) You should be screened each year for STIs, including gonorrhea and chlamydia, if: You are sexually active and are younger than 51 years of age. You are older than 51 years of age and your health care provider tells you that you are at risk for this type of infection. Your sexual activity has changed since you were last screened, and you are at increased risk for chlamydia or gonorrhea. Ask your health care provider if you are at risk. Ask your health care provider about whether you are at high risk for HIV. Your health care provider may recommend a prescription medicine to help prevent HIV infection. If you choose to take medicine to prevent HIV, you should first get tested for HIV. You should then be tested every 3 months for as long as you are taking the medicine. Follow these instructions at home: Alcohol use Do not drink alcohol if your health care provider tells you not to drink. If you drink alcohol: Limit how much you have to 0-2 drinks a day. Know how much alcohol is in your drink. In the [place] one drink equals one 12 oz bottle of beer (355 mL), one 5 oz glass of wine (148 mL), or one 1 oz glass of hard liquor (44 mL). Lifestyle Do not use any products that contain nicotine or tobacco. These products include cigarettes, chewing tobacco, and vaping devices, such as e-cigarettes. If you need help quitting, ask your health care provider. Do not use street drugs. Do not share needles. Ask your health care provider for help if you need support or  information about quitting drugs. General instructions Schedule regular health, dental, and eye exams. Stay current with your vaccines. Tell your health care provider if: You often feel depressed. You have ever been abused or do not feel safe at home. Summary Adopting a healthy lifestyle and getting preventive care are important in promoting health and wellness. Follow your health care provider's instructions about healthy diet, exercising, and getting tested or screened for diseases. Follow your health care provider's instructions on monitoring your cholesterol and blood pressure. This information is not intended to replace advice given to you by your health care provider. Make sure you discuss any questions you have with your health care provider. Document Revised: 12/05/2020 Document Reviewed: 12/05/2020 Elsevier Patient Education  [place].

## 2022-07-29 ENCOUNTER — Other Ambulatory Visit: Payer: Self-pay | Admitting: Family Medicine

## 2022-07-29 DIAGNOSIS — F429 Obsessive-compulsive disorder, unspecified: Secondary | ICD-10-CM

## 2022-08-10 ENCOUNTER — Ambulatory Visit: Payer: BC Managed Care – PPO | Admitting: Adult Health

## 2022-08-10 ENCOUNTER — Ambulatory Visit (INDEPENDENT_AMBULATORY_CARE_PROVIDER_SITE_OTHER): Payer: BC Managed Care – PPO

## 2022-08-10 VITALS — BP 130/82 | HR 53 | Temp 98.1°F | Ht 75.0 in | Wt 239.0 lb

## 2022-08-10 DIAGNOSIS — J4531 Mild persistent asthma with (acute) exacerbation: Secondary | ICD-10-CM

## 2022-08-10 DIAGNOSIS — H6993 Unspecified Eustachian tube disorder, bilateral: Secondary | ICD-10-CM

## 2022-08-10 DIAGNOSIS — R059 Cough, unspecified: Secondary | ICD-10-CM | POA: Diagnosis not present

## 2022-08-10 MED ORDER — FLUTICASONE PROPIONATE 50 MCG/ACT NA SUSP: 2.0000 | Freq: Every day | NASAL | 6 refills | Status: DC

## 2022-08-10 MED ORDER — PREDNISONE 10 MG PO TABS
ORAL_TABLET | ORAL | 0 refills | Status: DC
Start: 2022-08-10 — End: 2022-09-04

## 2022-08-10 NOTE — Progress Notes (Signed)
Subjective:    Patient ID: Dontrelle Mazon, male    DOB: 1971-05-31, 52 y.o.   MRN: [id number]  Sinus Problem   52 year old male who  has a past medical history of Asthma, migraines, and Hyperlipidemia.  He is a patient of Dr. Martinique who I am seeing today for an acute issue.   He reports that about 6-8 weeks ago he developed " chest congestion"  that started after he was blowing leaves. Since that time he has had a pretty constant tightness and congestion in his chest with a pretty dry cough, he will get a small amount of sputum up when he coughs at times. He has not had any fevers or chills and does not feel ill. He has been using his inhalers as directed for asthma; they work but symptoms come back   He also reports a feeling of fullness in his right ear and affects his hearing. When he wakes up in the morning the hearing is better. No pain or drainage.   He has not had any fevers or chills anda    Review of Systems See HPI   Past Medical History:  Diagnosis Date   Asthma    Hx of migraines    Occular   Hyperlipidemia     Social History   Socioeconomic History   Marital status: Married    Spouse name: Not on file   Number of children: Not on file   Years of education: Not on file   Highest education level: Not on file  Occupational History   Not on file  Tobacco Use   Smoking status: Never   Smokeless tobacco: Never  Vaping Use   Vaping Use: Never used  Substance and Sexual Activity   Alcohol use: Yes   Drug use: Never   Sexual activity: Yes    Partners: Female  Other Topics Concern   Not on file  Social History Narrative   Not on file   Social Determinants of Health   Financial Resource Strain: Not on file  Food Insecurity: Not on file  Transportation Needs: Not on file  Physical Activity: Not on file  Stress: Not on file  Social Connections: Not on file  Intimate Partner Violence: Not on file    Past Surgical History:  Procedure Laterality  Date   WISDOM TOOTH EXTRACTION      Family History  Problem Relation Age of Onset   Migraines Mother    Cancer Mother    Early death Mother    Hypertension Mother    COPD Father    Heart disease Father    Migraines Sister    Migraines Sister    Stroke Maternal Grandmother    Alcohol abuse Maternal Grandfather    Birth defects Maternal Grandfather    Early death Maternal Grandfather    Heart disease Paternal Grandmother    Stroke Paternal Grandfather     Allergies  Allergen Reactions   Prozac [Fluoxetine Hcl] Hives    Current Outpatient Medications on File Prior to Visit  Medication Sig Dispense Refill   albuterol (VENTOLIN HFA) 108 (90 Base) MCG/ACT inhaler Inhale 2 puffs into the lungs every 4 (four) hours as needed for wheezing or shortness of breath (cough). 18 g 1   citalopram (CELEXA) 40 MG tablet TAKE 1 TABLET(40 MG) BY MOUTH DAILY 90 tablet 1   fluticasone furoate-vilanterol (BREO ELLIPTA) 200-25 MCG/ACT AEPB Inhale 1 puff into the lungs daily. Rinse mouth after each use.  60 each 5   ketoconazole (NIZORAL) 2 % shampoo Apply 1 Application topically 2 (two) times a week. Until it clears up and then may use as needed. 120 mL 2   Olopatadine-Mometasone (RYALTRIS) 665-25 MCG/ACT SUSP Place 1-2 sprays into the nose in the morning and at bedtime. 29 g 5   No current facility-administered medications on file prior to visit.    BP 130/82   Pulse (!) 53   Temp 98.1 F (36.7 C) (Oral)   Ht '6\' 3"'$  (1.905 m)   Wt 239 lb (108.4 kg)   SpO2 97%   BMI 29.87 kg/m       Objective:   Physical Exam Vitals and nursing note reviewed.  Constitutional:      Appearance: Normal appearance.  HENT:     Right Ear: A middle ear effusion is present. Tympanic membrane is not erythematous or bulging.     Left Ear: A middle ear effusion is present. Tympanic membrane is not erythematous or bulging.  Cardiovascular:     Rate and Rhythm: Normal rate and regular rhythm.     Pulses: Normal  pulses.     Heart sounds: Normal heart sounds.  Pulmonary:     Effort: Pulmonary effort is normal.     Breath sounds: Normal breath sounds.  Musculoskeletal:        General: Normal range of motion.  Skin:    General: Skin is warm and dry.     Capillary Refill: Capillary refill takes less than 2 seconds.  Neurological:     General: No focal deficit present.     Mental Status: He is alert and oriented to person, place, and time.  Psychiatric:        Mood and Affect: Mood normal.        Behavior: Behavior normal.        Thought Content: Thought content normal.        Judgment: Judgment normal.       Assessment & Plan:  1. Mild persistent asthma with exacerbation - likely asthma related and will prescribed prednisone taper. Due to time frame will get chest xray today - predniSONE (DELTASONE) 10 MG tablet; 40 mg x 3 days, 20 mg x 3 days, 10 mg x 3 days  Dispense: 21 tablet; Refill: 0 - DG Chest 2 View; Future  2. Dysfunction of both eustachian tubes  - fluticasone (FLONASE) 50 MCG/ACT nasal spray; Place 2 sprays into both nostrils daily.  Dispense: 16 g; Refill: 6  Dorothyann Peng, NP

## 2022-08-10 NOTE — Patient Instructions (Addendum)
I think you are having an asthma flare  I have sent in a prednisone taper for you   I will follow up with you regarding your chest xray

## 2022-08-14 ENCOUNTER — Encounter: Payer: Self-pay | Admitting: Adult Health

## 2022-08-14 MED ORDER — DOXYCYCLINE HYCLATE 100 MG PO TABS: 100.0000 mg | ORAL_TABLET | Freq: Two times a day (BID) | ORAL | 0 refills | Status: AC

## 2022-08-14 NOTE — Telephone Encounter (Signed)
Please advise 

## 2022-09-03 NOTE — Progress Notes (Unsigned)
Follow Up Note  RE: Antonio House MRN: [id number] DOB: [birth year] Date of Office Visit: 09/04/2022  Referring provider: Martinique, Betty G, MD Primary care provider: Martinique, Betty G, MD  Chief Complaint: No chief complaint on file.  History of Present Illness: I had the pleasure of seeing Antonio House for a follow up visit at the [facility] on 09/03/2022. He is a 52 y.o. male, who is being followed for asthma, PND. His previous allergy office visit was on 05/01/2022 with Dr. Maudie Mercury. Today is a regular follow up visit.  Not well controlled moderate persistent asthma Past history - Diagnosed with asthma over 40 years ago. Tried Advair and Symbicort.  Main triggers are unknown but exertion makes things worse.  2020 spirometry showed mild obstruction with 8% improvement in FEV1 post bronchodilator treatment.  Clinically also feeling better. Interim history - non compliant with inhaler but does notice improvement if taking Breo daily. Today's spirometry showed: moderate obstructive disease with 6% and greater than 200cc improvement in FEV1 post bronchodilator treatment. Clinically feeling improved.  Daily controller medication(s): RESTART Breo 257mg 1 puff daily. Rinse mouth after each use.  Prior to physical activity: May use albuterol rescue inhaler 2 puffs 5 to 15 minutes prior to strenuous physical activities. Rescue medications: May use albuterol rescue inhaler 2 puffs every 4 to 6 hours as needed for shortness of breath, chest tightness, coughing, and wheezing. Monitor frequency of use.  Get spirometry at next visit.   Post-nasal drip Past history - Postnasal drip and throat clearing for 15 years.  No specific triggers noted.  Tried Nasonex inconsistently and Protonix with unknown benefit.  No recent ENT evaluation.  Skin testing over 10 years ago was negative per patient report. Interim history - unchanged. Not sure if he tried dymista after last OV or not.  Start  Ryaltris (olopatadine + mometasone nasal spray combination) 1-2 sprays per nostril twice a day. Sample given. Demonstrated proper use.  This replaces your other nasal sprays. If this works well for you, then have [facility] ship the medication to your home - prescription already sent in.  Recommend environmental allergy testing in the future once asthma is better controlled.    Seborrheic dermatitis Used to see dermatology for this in the past and used ketoconazole shampoo with good benefit but ran out. May use ketoconazole shampoo twice per week and then use as needed during flares.  If no improvement recommend seeing dermatology again.   Return in about 4 months (around 09/01/2022).  08/10/2022 PCP visit: "He reports that about 6-8 weeks ago he developed " chest congestion" that started after he was blowing leaves. Since that time he has had a pretty constant tightness and congestion in his chest with a pretty dry cough, he will get a small amount of sputum up when he coughs at times. He has not had any fevers or chills and does not feel ill. He has been using his inhalers as directed for asthma; they work but symptoms come back "  08/10/2022 CXR: "IMPRESSION: No acute cardiopulmonary process."  Assessment and Plan: [name] a 52y.o. male with: No problem-specific Assessment & Plan notes found for this encounter.  No follow-ups on file.  No orders of the defined types were placed in this encounter.  Lab Orders  No laboratory test(s) ordered today    Diagnostics: Spirometry:  Tracings reviewed. His effort: {Blank single:19197::"Good reproducible efforts.","It was hard to get consistent efforts and there is a  question as to whether this reflects a maximal maneuver.","Poor effort, data can not be interpreted."} FVC: ***L FEV1: ***L, ***% predicted FEV1/FVC ratio: ***% Interpretation: {Blank single:19197::"Spirometry consistent with mild obstructive disease","Spirometry consistent with  moderate obstructive disease","Spirometry consistent with severe obstructive disease","Spirometry consistent with possible restrictive disease","Spirometry consistent with mixed obstructive and restrictive disease","Spirometry uninterpretable due to technique","Spirometry consistent with normal pattern","No overt abnormalities noted given today's efforts"}.  Please see scanned spirometry results for details.  Skin Testing: {Blank single:19197::"Select foods","Environmental allergy panel","Environmental allergy panel and select foods","Food allergy panel","None","Deferred due to recent antihistamines use"}. *** Results discussed with patient/family.   Medication List:  Current Outpatient Medications  Medication Sig Dispense Refill   albuterol (VENTOLIN HFA) 108 (90 Base) MCG/ACT inhaler Inhale 2 puffs into the lungs every 4 (four) hours as needed for wheezing or shortness of breath (cough). 18 House 1   citalopram (CELEXA) 40 MG tablet TAKE 1 TABLET(40 MG) BY MOUTH DAILY 90 tablet 1   fluticasone (FLONASE) 50 MCG/ACT nasal spray Place 2 sprays into both nostrils daily. 16 House 6   fluticasone furoate-vilanterol (BREO ELLIPTA) 200-25 MCG/ACT AEPB Inhale 1 puff into the lungs daily. Rinse mouth after each use. 60 each 5   ketoconazole (NIZORAL) 2 % shampoo Apply 1 Application topically 2 (two) times a week. Until it clears up and then may use as needed. 120 mL 2   Olopatadine-Mometasone (RYALTRIS) 665-25 MCG/ACT SUSP Place 1-2 sprays into the nose in the morning and at bedtime. 29 House 5   predniSONE (DELTASONE) 10 MG tablet 40 mg x 3 days, 20 mg x 3 days, 10 mg x 3 days 21 tablet 0   No current facility-administered medications for this visit.   Allergies: Allergies  Allergen Reactions   Prozac [Fluoxetine Hcl] Hives   I reviewed his past medical history, social history, family history, and environmental history and no significant changes have been reported from his previous visit.  Review of Systems   Constitutional:  Negative for appetite change, chills, fever and unexpected weight change.  HENT:  Positive for postnasal drip. Negative for congestion and rhinorrhea.   Eyes:  Negative for itching.  Respiratory:  Positive for cough, chest tightness and wheezing. Negative for shortness of breath.   Cardiovascular:  Negative for chest pain.  Gastrointestinal:  Negative for abdominal pain.  Genitourinary:  Negative for difficulty urinating.  Skin:  Positive for rash.  Allergic/Immunologic: Negative for food allergies.  Neurological:  Negative for headaches.    Objective: There were no vitals taken for this visit. There is no height or weight on file to calculate BMI. Physical Exam Vitals and nursing note reviewed.  Constitutional:      Appearance: Normal appearance. He is well-developed.  HENT:     Head: Normocephalic and atraumatic.     Right Ear: Tympanic membrane and external ear normal.     Left Ear: Tympanic membrane and external ear normal.     Nose: Nose normal.  Eyes:     Conjunctiva/sclera: Conjunctivae normal.  Cardiovascular:     Rate and Rhythm: Normal rate and regular rhythm.     Heart sounds: Normal heart sounds. No murmur heard.    No friction rub. No gallop.  Pulmonary:     Effort: Pulmonary effort is normal.     Breath sounds: Normal breath sounds. No wheezing or rales.  Musculoskeletal:     Cervical back: Neck supple.  Skin:    General: Skin is warm.     Findings: No rash.  Neurological:     Mental Status: He is alert and oriented to person, place, and time.  Psychiatric:        Behavior: Behavior normal.    Previous notes and tests were reviewed. The plan was reviewed with the patient/family, and all questions/concerned were addressed.  It was my pleasure to see Antonio House today and participate in his care. Please feel free to contact me with any questions or concerns.  Sincerely,  Rexene Alberts, DO Allergy & Immunology  [facility] office: [place] office: [phone]

## 2022-09-04 ENCOUNTER — Ambulatory Visit: Payer: BC Managed Care – PPO | Admitting: Allergy

## 2022-09-04 ENCOUNTER — Encounter: Payer: Self-pay | Admitting: Allergy

## 2022-09-04 VITALS — BP 122/74 | HR 56 | Resp 16

## 2022-09-04 DIAGNOSIS — R0982 Postnasal drip: Secondary | ICD-10-CM

## 2022-09-04 DIAGNOSIS — J4540 Moderate persistent asthma, uncomplicated: Secondary | ICD-10-CM | POA: Diagnosis not present

## 2022-09-04 DIAGNOSIS — J310 Chronic rhinitis: Secondary | ICD-10-CM

## 2022-09-04 DIAGNOSIS — H6121 Impacted cerumen, right ear: Secondary | ICD-10-CM | POA: Insufficient documentation

## 2022-09-04 MED ORDER — AZITHROMYCIN 250 MG PO TABS: ORAL_TABLET | ORAL | 0 refills | Status: DC

## 2022-09-04 MED ORDER — ALBUTEROL SULFATE HFA 108 (90 BASE) MCG/ACT IN AERS: 2.0000 | INHALATION_SPRAY | RESPIRATORY_TRACT | 1 refills | Status: AC | PRN

## 2022-09-04 MED ORDER — FLUTICASONE PROPIONATE 50 MCG/ACT NA SUSP: 2.0000 | Freq: Every day | NASAL | 3 refills | Status: AC

## 2022-09-04 MED ORDER — BREO ELLIPTA 200-25 MCG/ACT IN AEPB: 1.0000 | INHALATION_SPRAY | Freq: Every day | RESPIRATORY_TRACT | 2 refills | Status: DC

## 2022-09-04 MED ORDER — PREDNISONE 10 MG PO TABS: ORAL_TABLET | ORAL | 0 refills | Status: DC

## 2022-09-04 NOTE — Assessment & Plan Note (Signed)
Past history - Postnasal drip and throat clearing for 15 years.  No specific triggers noted.  Tried Nasonex inconsistently and Protonix with unknown benefit.  No recent ENT evaluation.  Skin testing over 10 years ago was negative per patient report. Interim history - did not try Ryaltris.  Use Flonase (fluticasone) nasal spray 2 sprays per nostril once a day as needed for nasal congestion.  Nasal saline spray (i.e., Simply Saline) or nasal saline lavage (i.e., NeilMed) is recommended as needed and prior to medicated nasal sprays. Recommend environmental allergy testing.

## 2022-09-04 NOTE — Patient Instructions (Addendum)
Coughing Take prednisone '40mg'$  daily x 2 days, '30mg'$  daily x 2 days, '20mg'$  daily x 2 days and '10mg'$  daily x 2 days. Start Zpak - take 2 tablets on day 1, then 1 tablet on days 2-5.  Asthma: Daily controller medication(s): RESTART Breo 253mg 1 puff daily. Rinse mouth after each use.  Prior to physical activity: May use albuterol rescue inhaler 2 puffs 5 to 15 minutes prior to strenuous physical activities. Rescue medications: May use albuterol rescue inhaler 2 puffs every 4 to 6 hours as needed for shortness of breath, chest tightness, coughing, and wheezing. Monitor frequency of use.  Asthma control goals:  Full participation in all desired activities (may need albuterol before activity) Albuterol use two times or less a week on average (not counting use with activity) Cough interfering with sleep two times or less a month Oral steroids no more than once a year No hospitalizations  Recommend environmental allergy testing in the future once asthma is better controlled.   Right ear - earwax. Use over the counter debrox 5-10 drops twice a day for up to 4 days in a row to soften the earwax.   Rhinitis Use Flonase (fluticasone) nasal spray 2 sprays per nostril once a day as needed for nasal congestion.  Nasal saline spray (i.e., Simply Saline) or nasal saline lavage (i.e., NeilMed) is recommended as needed and prior to medicated nasal sprays.  Follow up in 2 months or sooner if needed.

## 2022-09-04 NOTE — Assessment & Plan Note (Signed)
Past history - Diagnosed with asthma over 40 years ago. Tried Advair and Symbicort.  Main triggers are unknown but exertion makes things worse.  2020 spirometry showed mild obstruction with 8% improvement in FEV1 post bronchodilator treatment.  Clinically also feeling better. Interim history - ran out of Breo again a few weeks ago. Noted coughing since November and had course of antibiotics and prednisone in January with no benefit. Normal CXR in 2024.  Today's spirometry showed: moderate obstructive disease. Take prednisone '40mg'$  daily x 2 days, '30mg'$  daily x 2 days, '20mg'$  daily x 2 days and '10mg'$  daily x 2 days. Start Zpak - take 2 tablets on day 1, then 1 tablet on days 2-5. Daily controller medication(s): RESTART Breo 259mg 1 puff daily. Rinse mouth after each use.  Stressed importance of taking it daily.  Prior to physical activity: May use albuterol rescue inhaler 2 puffs 5 to 15 minutes prior to strenuous physical activities. Rescue medications: May use albuterol rescue inhaler 2 puffs every 4 to 6 hours as needed for shortness of breath, chest tightness, coughing, and wheezing. Monitor frequency of use.  Recommend environmental allergy testing in the future once asthma is better controlled.  Get spirometry at next visit.

## 2022-09-04 NOTE — Assessment & Plan Note (Signed)
Use over the counter debrox 5-10 drops twice a day for up to 4 days in a row to soften the earwax.

## 2022-12-10 ENCOUNTER — Other Ambulatory Visit: Payer: Self-pay | Admitting: Family Medicine

## 2022-12-10 DIAGNOSIS — F429 Obsessive-compulsive disorder, unspecified: Secondary | ICD-10-CM

## 2023-01-16 NOTE — Progress Notes (Unsigned)
Follow Up Note  RE: Antonio House MRN: [id number] DOB: [birth year] Date of Office Visit: 01/17/2023  Referring provider: [place], Betty G, MD Primary care provider: [place], Betty G, MD  Chief Complaint: No chief complaint on file.  History of Present Illness: I had the pleasure of seeing [name] for a follow up visit at the [facility] on 01/16/2023. He is a 52 y.o. male, who is being followed for asthma, chronic rhinitis. His previous allergy office visit was on 09/04/2022 with Dr. Selena Batten. Today is a regular follow up visit.  Not well controlled moderate persistent asthma Past history - Diagnosed with asthma over 40 years ago. Tried Advair and Symbicort.  Main triggers are unknown but exertion makes things worse.  2020 spirometry showed mild obstruction with 8% improvement in FEV1 post bronchodilator treatment.  Clinically also feeling better. Interim history - ran out of Breo again a few weeks ago. Noted coughing since November and had course of antibiotics and prednisone in January with no benefit. Normal CXR in 2024.  Today's spirometry showed: moderate obstructive disease. Take prednisone 40mg  daily x 2 days, 30mg  daily x 2 days, 20mg  daily x 2 days and 10mg  daily x 2 days. Start Zpak - take 2 tablets on day 1, then 1 tablet on days 2-5. Daily controller medication(s): RESTART Breo 1 puff daily. Rinse mouth after each use.  Stressed importance of taking it daily.  Prior to physical activity: May use albuterol rescue inhaler 2 puffs 5 to 15 minutes prior to strenuous physical activities. Rescue medications: May use albuterol rescue inhaler 2 puffs every 4 to 6 hours as needed for shortness of breath, chest tightness, coughing, and wheezing. Monitor frequency of use.  Recommend environmental allergy testing in the future once asthma is better controlled.  Get spirometry at next visit.    Chronic rhinitis Past history - Postnasal drip and throat  clearing for 15 years.  No specific triggers noted.  Tried Nasonex inconsistently and Protonix with unknown benefit.  No recent ENT evaluation.  Skin testing over 10 years ago was negative per patient report. Interim history - did not try Ryaltris.  Use Flonase (fluticasone) nasal spray 2 sprays per nostril once a day as needed for nasal congestion.  Nasal saline spray (i.e., Simply Saline) or nasal saline lavage (i.e., NeilMed) is recommended as needed and prior to medicated nasal sprays. Recommend environmental allergy testing.    Excessive cerumen in right ear canal Use over the counter debrox 5-10 drops twice a day for up to 4 days in a row to soften the earwax.    Return in about 2 months (around 11/03/2022).  Assessment and Plan: [name] is a 52 y.o. male with: No problem-specific Assessment & Plan notes found for this encounter.  No follow-ups on file.  No orders of the defined types were placed in this encounter.  Lab Orders  No laboratory test(s) ordered today    Diagnostics: Spirometry:  Tracings reviewed. His effort: {Blank single:19197::"Good reproducible efforts.","It was hard to get consistent efforts and there is a question as to whether this reflects a maximal maneuver.","Poor effort, data can not be interpreted."} FVC: ***L FEV1: ***L, ***% predicted FEV1/FVC ratio: ***% Interpretation: {Blank single:19197::"Spirometry consistent with mild obstructive disease","Spirometry consistent with moderate obstructive disease","Spirometry consistent with severe obstructive disease","Spirometry consistent with possible restrictive disease","Spirometry consistent with mixed obstructive and restrictive disease","Spirometry uninterpretable due to technique","Spirometry consistent with normal pattern","No overt abnormalities noted given today's efforts"}.  Please see  scanned spirometry results for details.  Skin Testing: {Blank single:19197::"Select foods","Environmental allergy  panel","Environmental allergy panel and select foods","Food allergy panel","None","Deferred due to recent antihistamines use"}. *** Results discussed with patient/family.   Medication List:  Current Outpatient Medications  Medication Sig Dispense Refill   albuterol (VENTOLIN HFA) 108 (90 Base) MCG/ACT inhaler Inhale 2 puffs into the lungs every 4 (four) hours as needed for wheezing or shortness of breath (coughing fits). 18 g 1   azithromycin (ZITHROMAX Z-PAK) 250 MG tablet Take 2 tablets on day 1, then 1 tablet from days 2-5. 6 each 0   BREO ELLIPTA 200-25 MCG/ACT AEPB Inhale 1 puff into the lungs daily. Rinse mouth after each use. 180 each 2   citalopram (CELEXA) 40 MG tablet TAKE 1 TABLET(40 MG) BY MOUTH DAILY 90 tablet 1   fluticasone (FLONASE) 50 MCG/ACT nasal spray Place 2 sprays into both nostrils daily. 48 g 3   ketoconazole (NIZORAL) 2 % shampoo Apply 1 Application topically 2 (two) times a week. Until it clears up and then may use as needed. 120 mL 2   predniSONE (DELTASONE) 10 MG tablet Take prednisone 40mg  daily x 2 days, 30mg  daily x 2 days, 20mg  daily x 2 days and 10mg  daily x 2 days. 20 tablet 0   No current facility-administered medications for this visit.   Allergies: Allergies  Allergen Reactions   Prozac [Fluoxetine Hcl] Hives   I reviewed his past medical history, social history, family history, and environmental history and no significant changes have been reported from his previous visit.  Review of Systems  Constitutional:  Negative for appetite change, chills, fever and unexpected weight change.  HENT:  Negative for congestion, postnasal drip and rhinorrhea.        Right ear fullness  Eyes:  Negative for itching.  Respiratory:  Positive for cough. Negative for chest tightness, shortness of breath and wheezing.   Cardiovascular:  Negative for chest pain.  Gastrointestinal:  Negative for abdominal pain.  Genitourinary:  Negative for difficulty urinating.  Skin:   Positive for rash.  Allergic/Immunologic: Negative for food allergies.  Neurological:  Negative for headaches.    Objective: There were no vitals taken for this visit. There is no height or weight on file to calculate BMI. Physical Exam Vitals and nursing note reviewed.  Constitutional:      Appearance: Normal appearance. He is well-developed.  HENT:     Head: Normocephalic and atraumatic.     Right Ear: External ear normal.     Left Ear: Tympanic membrane and external ear normal.     Ears:     Comments: Right ear cerumen present.    Nose: Nose normal.  Eyes:     Conjunctiva/sclera: Conjunctivae normal.  Cardiovascular:     Rate and Rhythm: Normal rate and regular rhythm.     Heart sounds: Normal heart sounds. No murmur heard.    No friction rub. No gallop.  Pulmonary:     Effort: Pulmonary effort is normal.     Breath sounds: Normal breath sounds. No wheezing or rales.  Musculoskeletal:     Cervical back: Neck supple.  Skin:    General: Skin is warm.     Findings: No rash.  Neurological:     Mental Status: He is alert and oriented to person, place, and time.  Psychiatric:        Behavior: Behavior normal.    Previous notes and tests were reviewed. The plan was reviewed with the patient/family, and  all questions/concerned were addressed.  It was my pleasure to see Antonio House today and participate in his care. Please feel free to contact me with any questions or concerns.  Sincerely,  Wyline Mood, DO Allergy & Immunology  [facility] office: [phone] [facility] office: [phone]

## 2023-01-17 ENCOUNTER — Ambulatory Visit: Payer: BC Managed Care – PPO | Admitting: Allergy

## 2023-01-17 ENCOUNTER — Encounter: Payer: Self-pay | Admitting: Allergy

## 2023-01-17 ENCOUNTER — Other Ambulatory Visit: Payer: Self-pay

## 2023-01-17 VITALS — BP 124/80 | HR 58 | Temp 98.1°F | Resp 16 | Ht 75.5 in | Wt 248.5 lb

## 2023-01-17 DIAGNOSIS — R053 Chronic cough: Secondary | ICD-10-CM

## 2023-01-17 DIAGNOSIS — K219 Gastro-esophageal reflux disease without esophagitis: Secondary | ICD-10-CM

## 2023-01-17 DIAGNOSIS — J310 Chronic rhinitis: Secondary | ICD-10-CM

## 2023-01-17 DIAGNOSIS — J4540 Moderate persistent asthma, uncomplicated: Secondary | ICD-10-CM | POA: Diagnosis not present

## 2023-01-17 MED ORDER — AZELASTINE HCL 0.1 % NA SOLN: 1.0000 | Freq: Two times a day (BID) | NASAL | 2 refills | Status: DC | PRN

## 2023-01-17 MED ORDER — PANTOPRAZOLE SODIUM 40 MG PO TBEC: 40.0000 mg | DELAYED_RELEASE_TABLET | Freq: Every day | ORAL | 2 refills | Status: DC

## 2023-01-17 NOTE — Assessment & Plan Note (Signed)
Past history - Postnasal drip and throat clearing for 15 years.  No specific triggers noted.  Tried Nasonex inconsistently and Protonix with unknown benefit.  No recent ENT evaluation.  Skin testing over 10 years ago was negative per patient report. Interim history - Flonase ineffective.  Use azelastine nasal spray 1-2 sprays per nostril twice a day as needed for runny nose/drainage. Recommend environmental allergy testing in future.

## 2023-01-17 NOTE — Patient Instructions (Addendum)
Coughing The most common causes of chronic cough include the following: upper airway cough syndrome (UACS) which is caused by variety of rhinitis conditions; asthma; gastroesophageal reflux disease (GERD); chronic bronchitis from cigarette smoking or other inhaled environmental irritants; non-asthmatic eosinophilic bronchitis; and bronchiectasis.  In prospective studies, these conditions have accounted for up to 94% of the causes of chronic cough in immunocompetent adults.   In your case, I'm not sure what's triggering your cough. We will try medications for reflux and a nasal pray. Start pantoprazole 40mg  once a day in the mornings. Nothing to eat or drink for 30 min afterwards. Use azelastine nasal spray 1-2 sprays per nostril twice a day as needed for runny nose/drainage. If no improvement after 1 month then get chest X-ray.   Asthma: Breathing test was normal today.  Daily controller medication(s): continue Breo 1 puff daily. Rinse mouth after each use.  Prior to physical activity: May use albuterol rescue inhaler 2 puffs 5 to 15 minutes prior to strenuous physical activities. Rescue medications: May use albuterol rescue inhaler 2 puffs every 4 to 6 hours as needed for shortness of breath, chest tightness, coughing, and wheezing. Monitor frequency of use.  Asthma control goals:  Full participation in all desired activities (may need albuterol before activity) Albuterol use two times or less a week on average (not counting use with activity) Cough interfering with sleep two times or less a month Oral steroids no more than once a year No hospitalizations  Follow up in 2 months or sooner if needed.

## 2023-01-17 NOTE — Assessment & Plan Note (Signed)
The most common causes of chronic cough include the following: upper airway cough syndrome (UACS) which is caused by variety of rhinitis conditions; asthma; gastroesophageal reflux disease (GERD); chronic bronchitis from cigarette smoking or other inhaled environmental irritants; non-asthmatic eosinophilic bronchitis; and bronchiectasis.  In prospective studies, these conditions have accounted for up to 94% of the causes of chronic cough in immunocompetent adults.  Start pantoprazole 40mg  once a day in the mornings. Nothing to eat or drink for 30 min afterwards. Use azelastine nasal spray 1-2 sprays per nostril twice a day as needed for runny nose/drainage. If no improvement after 1 month then get chest X-ray.

## 2023-01-17 NOTE — Assessment & Plan Note (Signed)
Past history - Diagnosed with asthma over 40 years ago. Tried Advair and Symbicort.  Main triggers are unknown but exertion makes things worse.  2020 spirometry showed mild obstruction with 8% improvement in FEV1 post bronchodilator treatment.  Clinically also feeling better. Normal CXR in 2024.  Interim history - doing better with daily Breo but still has some coughing. No additional prednisone.  Today's spirometry was normal.  Daily controller medication(s): continue Breo 1 puff daily. Rinse mouth after each use.  Prior to physical activity: May use albuterol rescue inhaler 2 puffs 5 to 15 minutes prior to strenuous physical activities. Rescue medications: May use albuterol rescue inhaler 2 puffs every 4 to 6 hours as needed for shortness of breath, chest tightness, coughing, and wheezing. Monitor frequency of use.  Recommend environmental allergy testing in the future once coughing resolved.  Get spirometry at next visit.

## 2023-01-17 NOTE — Assessment & Plan Note (Signed)
See handout for lifestyle and dietary modifications. Start pantoprazole 40mg  once day - nothing to eat or drink for 20-30 minutes afterwards.

## 2023-03-13 DIAGNOSIS — H6693 Otitis media, unspecified, bilateral: Secondary | ICD-10-CM | POA: Diagnosis not present

## 2023-03-13 DIAGNOSIS — J029 Acute pharyngitis, unspecified: Secondary | ICD-10-CM | POA: Diagnosis not present

## 2023-03-20 NOTE — Progress Notes (Unsigned)
Follow Up Note  RE: Antonio House MRN: [id number] DOB: 27-May-1971 Date of Office Visit: 03/21/2023  Referring provider: [place], Betty G, MD Primary care provider: [place], Betty G, MD  Chief Complaint: No chief complaint on file.  History of Present Illness: I had the pleasure of seeing Antonio House for a follow up visit at the [facility] on 03/20/2023. He is a 52 y.o. male, who is being followed for asthma, chronic cough, chronic rhinitis and possible GERD. His previous allergy office visit was on 01/17/2023 with Dr. Selena Batten. Today is a regular follow up visit.  Moderate persistent asthma Past history - Diagnosed with asthma over 40 years ago. Tried Advair and Symbicort.  Main triggers are unknown but exertion makes things worse.  2020 spirometry showed mild obstruction with 8% improvement in FEV1 post bronchodilator treatment.  Clinically also feeling better. Normal CXR in 2024.  Interim history - doing better with daily Breo but still has some coughing. No additional prednisone.  Today's spirometry was normal.  Daily controller medication(s): continue Breo 1 puff daily. Rinse mouth after each use.  Prior to physical activity: May use albuterol rescue inhaler 2 puffs 5 to 15 minutes prior to strenuous physical activities. Rescue medications: May use albuterol rescue inhaler 2 puffs every 4 to 6 hours as needed for shortness of breath, chest tightness, coughing, and wheezing. Monitor frequency of use.  Recommend environmental allergy testing in the future once coughing resolved.  Get spirometry at next visit.    Chronic cough The most common causes of chronic cough include the following: upper airway cough syndrome (UACS) which is caused by variety of rhinitis conditions; asthma; gastroesophageal reflux disease (GERD); chronic bronchitis from cigarette smoking or other inhaled environmental irritants; non-asthmatic eosinophilic bronchitis; and bronchiectasis.   In prospective studies, these conditions have accounted for up to 94% of the causes of chronic cough in immunocompetent adults.  Start pantoprazole 40mg  once a day in the mornings. Nothing to eat or drink for 30 min afterwards. Use azelastine nasal spray 1-2 sprays per nostril twice a day as needed for runny nose/drainage. If no improvement after 1 month then get chest X-ray.    Chronic rhinitis Past history - Postnasal drip and throat clearing for 15 years.  No specific triggers noted.  Tried Nasonex inconsistently and Protonix with unknown benefit.  No recent ENT evaluation.  Skin testing over 10 years ago was negative per patient report. Interim history - Flonase ineffective.  Use azelastine nasal spray 1-2 sprays per nostril twice a day as needed for runny nose/drainage. Recommend environmental allergy testing in future.    Possible GERD See handout for lifestyle and dietary modifications. Start pantoprazole 40mg  once day - nothing to eat or drink for 20-30 minutes afterwards.    Return in about 2 months (around 03/19/2023).  Assessment and Plan: Antonio House is a 52 y.o. male with: Moderate persistent asthma without complication Past history - Diagnosed with asthma over 40 years ago. Tried Advair and Symbicort.  Main triggers are unknown but exertion makes things worse.  2020 spirometry showed mild obstruction with 8% improvement in FEV1 post bronchodilator treatment.  Clinically also feeling better. Normal CXR in 2024.   Chronic rhinitis Past history - Postnasal drip and throat clearing for 15 years. Tried Nasonex inconsistently and Protonix with unknown benefit.  No recent ENT evaluation.  Skin testing over 10 years ago was negative per patient report.  GERD ***   No follow-ups on file.  No orders of the defined types were placed in this encounter.  Lab Orders  No laboratory test(s) ordered today    Diagnostics: Spirometry:  Tracings reviewed. His effort: {Blank  single:19197::"Good reproducible efforts.","It was hard to get consistent efforts and there is a question as to whether this reflects a maximal maneuver.","Poor effort, data can not be interpreted."} FVC: ***L FEV1: ***L, ***% predicted FEV1/FVC ratio: ***% Interpretation: {Blank single:19197::"Spirometry consistent with mild obstructive disease","Spirometry consistent with moderate obstructive disease","Spirometry consistent with severe obstructive disease","Spirometry consistent with possible restrictive disease","Spirometry consistent with mixed obstructive and restrictive disease","Spirometry uninterpretable due to technique","Spirometry consistent with normal pattern","No overt abnormalities noted given today's efforts"}.  Please see scanned spirometry results for details.  Skin Testing: {Blank single:19197::"Select foods","Environmental allergy panel","Environmental allergy panel and select foods","Food allergy panel","None","Deferred due to recent antihistamines use"}. *** Results discussed with patient/family.   Medication List:  Current Outpatient Medications  Medication Sig Dispense Refill  . albuterol (VENTOLIN HFA) 108 (90 Base) MCG/ACT inhaler Inhale 2 puffs into the lungs every 4 (four) hours as needed for wheezing or shortness of breath (coughing fits). 18 g 1  . azelastine (ASTELIN) 0.1 % nasal spray Place 1-2 sprays into both nostrils 2 (two) times daily as needed (nasal drainage). Use in each nostril as directed 30 mL 2  . BREO ELLIPTA 200-25 MCG/ACT AEPB Inhale 1 puff into the lungs daily. Rinse mouth after each use. 180 each 2  . citalopram (CELEXA) 40 MG tablet TAKE 1 TABLET(40 MG) BY MOUTH DAILY 90 tablet 1  . fluticasone (FLONASE) 50 MCG/ACT nasal spray Place 2 sprays into both nostrils daily. 48 g 3  . ketoconazole (NIZORAL) 2 % shampoo Apply 1 Application topically 2 (two) times a week. Until it clears up and then may use as needed. 120 mL 2  . pantoprazole (PROTONIX) 40  MG tablet Take 1 tablet (40 mg total) by mouth daily. 30 tablet 2   No current facility-administered medications for this visit.   Allergies: Allergies  Allergen Reactions  . Prozac [Fluoxetine Hcl] Hives   I reviewed his past medical history, social history, family history, and environmental history and no significant changes have been reported from his previous visit.  Review of Systems  Constitutional:  Negative for appetite change, chills, fever and unexpected weight change.  HENT:  Negative for congestion, postnasal drip and rhinorrhea.   Eyes:  Negative for itching.  Respiratory:  Positive for cough. Negative for chest tightness, shortness of breath and wheezing.   Cardiovascular:  Negative for chest pain.  Gastrointestinal:  Negative for abdominal pain.  Genitourinary:  Negative for difficulty urinating.  Allergic/Immunologic: Negative for food allergies.  Neurological:  Negative for headaches.   Objective: There were no vitals taken for this visit. There is no height or weight on file to calculate BMI. Physical Exam Vitals and nursing note reviewed.  Constitutional:      Appearance: Normal appearance. He is well-developed.  HENT:     Head: Normocephalic and atraumatic.     Right Ear: Tympanic membrane and external ear normal.     Left Ear: Tympanic membrane and external ear normal.     Nose: Nose normal.  Eyes:     Conjunctiva/sclera: Conjunctivae normal.  Cardiovascular:     Rate and Rhythm: Normal rate and regular rhythm.     Heart sounds: Normal heart sounds. No murmur heard.    No friction rub. No gallop.  Pulmonary:     Effort: Pulmonary effort is normal.  Breath sounds: Normal breath sounds. No wheezing or rales.  Musculoskeletal:     Cervical back: Neck supple.  Skin:    General: Skin is warm.     Findings: No rash.  Neurological:     Mental Status: He is alert and oriented to person, place, and time.  Psychiatric:        Behavior: Behavior normal.   Previous notes and tests were reviewed. The plan was reviewed with the patient/family, and all questions/concerned were addressed.  It was my pleasure to see Belmin today and participate in his care. Please feel free to contact me with any questions or concerns.  Sincerely,  Wyline Mood, DO Allergy & Immunology  [facility] office: [phone] [facility] office: [phone]

## 2023-03-21 ENCOUNTER — Ambulatory Visit: Payer: BC Managed Care – PPO | Admitting: Allergy

## 2023-03-21 ENCOUNTER — Encounter: Payer: Self-pay | Admitting: Allergy

## 2023-03-21 VITALS — BP 118/74 | HR 48 | Temp 98.1°F | Resp 16 | Wt 244.5 lb

## 2023-03-21 DIAGNOSIS — J4540 Moderate persistent asthma, uncomplicated: Secondary | ICD-10-CM | POA: Diagnosis not present

## 2023-03-21 DIAGNOSIS — R053 Chronic cough: Secondary | ICD-10-CM

## 2023-03-21 DIAGNOSIS — J310 Chronic rhinitis: Secondary | ICD-10-CM | POA: Diagnosis not present

## 2023-03-21 DIAGNOSIS — K219 Gastro-esophageal reflux disease without esophagitis: Secondary | ICD-10-CM | POA: Diagnosis not present

## 2023-03-21 MED ORDER — IPRATROPIUM BROMIDE 0.03 % NA SOLN: 1.0000 | Freq: Two times a day (BID) | NASAL | 3 refills | Status: DC | PRN

## 2023-03-21 NOTE — Patient Instructions (Addendum)
Coughing Next time you have the coughing fits - take albuterol 2 puffs and see if it makes your symptoms go away faster. If yes, I'll change the Breo to a stronger inhaler.  Continue pantoprazole 40mg  once a day in the mornings. Nothing to eat or drink for 30 min afterwards. Use Atrovent (ipratropium) 0.03% 1-2 sprays per nostril twice a day as needed for runny nose/drainage. If no improvement after 1 month then get chest X-ray.   Asthma: Daily controller medication(s): continue Breo 1 puff daily. Rinse mouth after each use.  Prior to physical activity: May use albuterol rescue inhaler 2 puffs 5 to 15 minutes prior to strenuous physical activities. Rescue medications: May use albuterol rescue inhaler 2 puffs every 4 to 6 hours as needed for shortness of breath, chest tightness, coughing, and wheezing. Monitor frequency of use.  Asthma control goals:  Full participation in all desired activities (may need albuterol before activity) Albuterol use two times or less a week on average (not counting use with activity) Cough interfering with sleep two times or less a month Oral steroids no more than once a year No hospitalizations  Follow up in 2 months or sooner if needed.

## 2023-05-16 ENCOUNTER — Telehealth: Payer: Self-pay | Admitting: Family Medicine

## 2023-05-16 DIAGNOSIS — Z1211 Encounter for screening for malignant neoplasm of colon: Secondary | ICD-10-CM

## 2023-05-16 NOTE — Telephone Encounter (Signed)
Patient is requesting a referral to GI to get a colonoscopy.

## 2023-05-17 NOTE — Telephone Encounter (Signed)
Referral entered  

## 2023-05-20 NOTE — Progress Notes (Unsigned)
Follow Up Note  RE: Antonio House MRN: [id number] DOB: 02/19/1971 Date of Office Visit: 05/21/2023  Referring provider: [place], Betty G, MD Primary care provider: [place], Betty G, MD  Chief Complaint: No chief complaint on file.  History of Present Illness: I had the pleasure of seeing [name] for a follow up visit at the [facility] on 05/20/2023. He is a 52 y.o. male, who is being followed for asthma, chronic rhinitis and GERD. His previous allergy office visit was on 03/21/2023 with Dr. Selena Batten. Today is a regular follow up visit.  Discussed the use of AI scribe software for clinical note transcription with the patient, who gave verbal consent to proceed.  History of Present Illness            Moderate persistent asthma without complication Chronic cough  Past history - Diagnosed with asthma over 40 years ago. Tried Advair and Symbicort.  Main triggers are unknown but exertion makes things worse.  2020 spirometry showed mild obstruction with 8% improvement in FEV1 post bronchodilator treatment.  Clinically also feeling better. Normal CXR in 2024.  Interim history - still coughing. Slightly better. Next time you have the coughing fits - take albuterol 2 puffs and see if it makes your symptoms go away faster. If yes, I'll change the Breo to Trelegy.  Use Atrovent (ipratropium) 0.03% 1-2 sprays per nostril twice a day as needed for runny nose/drainage. If no improvement after 1 month then get chest X-ray.    Chronic rhinitis Past history - Postnasal drip and throat clearing for 15 years. Tried Nasonex inconsistently and Protonix with unknown benefit.  No recent ENT evaluation.  Skin testing over 10 years ago was negative per patient report. Flonase ineffective. Azelastine caused sneezing.  Use Atrovent (ipratropium) 0.03% 1-2 sprays per nostril twice a day as needed for runny nose/drainage.   GERD Continue pantoprazole 40mg  once a day in the  mornings. Nothing to eat or drink for 30 min afterwards.  Assessment and Plan: Antonio House is a 52 y.o. male with: Moderate persistent asthma without complication Chronic cough  Past history - Diagnosed with asthma over 40 years ago. Tried Advair and Symbicort.  Main triggers are unknown but exertion makes things worse.  2020 spirometry showed mild obstruction with 8% improvement in FEV1 post bronchodilator treatment.  Clinically also feeling better. Normal CXR in 2024.  Interim history - still coughing. Slightly better. Next time you have the coughing fits - take albuterol 2 puffs and see if it makes your symptoms go away faster. If yes, I'll change the Breo to Trelegy.  Use Atrovent (ipratropium) 0.03% 1-2 sprays per nostril twice a day as needed for runny nose/drainage. If no improvement after 1 month then get chest X-ray.    Chronic rhinitis Past history - Postnasal drip and throat clearing for 15 years. Tried Nasonex inconsistently and Protonix with unknown benefit.  No recent ENT evaluation.  Skin testing over 10 years ago was negative per patient report. Flonase ineffective. Azelastine caused sneezing.  Use Atrovent (ipratropium) 0.03% 1-2 sprays per nostril twice a day as needed for runny nose/drainage.   GERD Continue pantoprazole 40mg  once a day in the mornings. Nothing to eat or drink for 30 min afterwards. Assessment and Plan              No follow-ups on file.  No orders of the defined types were placed in this encounter.  Lab Orders  No laboratory test(s) ordered today  Diagnostics: Spirometry:  Tracings reviewed. His effort: {Blank single:19197::"Good reproducible efforts.","It was hard to get consistent efforts and there is a question as to whether this reflects a maximal maneuver.","Poor effort, data can not be interpreted."} FVC: ***L FEV1: ***L, ***% predicted FEV1/FVC ratio: ***% Interpretation: {Blank single:19197::"Spirometry consistent with mild  obstructive disease","Spirometry consistent with moderate obstructive disease","Spirometry consistent with severe obstructive disease","Spirometry consistent with possible restrictive disease","Spirometry consistent with mixed obstructive and restrictive disease","Spirometry uninterpretable due to technique","Spirometry consistent with normal pattern","No overt abnormalities noted given today's efforts"}.  Please see scanned spirometry results for details.  Skin Testing: {Blank single:19197::"Select foods","Environmental allergy panel","Environmental allergy panel and select foods","Food allergy panel","None","Deferred due to recent antihistamines use"}. *** Results discussed with patient/family.   Medication List:  Current Outpatient Medications  Medication Sig Dispense Refill  . albuterol (VENTOLIN HFA) 108 (90 Base) MCG/ACT inhaler Inhale 2 puffs into the lungs every 4 (four) hours as needed for wheezing or shortness of breath (coughing fits). 18 g 1  . amoxicillin (AMOXIL) 875 MG tablet Take 875 mg by mouth 2 (two) times daily.    Marland Kitchen BREO ELLIPTA 200-25 MCG/ACT AEPB Inhale 1 puff into the lungs daily. Rinse mouth after each use. 180 each 2  . citalopram (CELEXA) 40 MG tablet TAKE 1 TABLET(40 MG) BY MOUTH DAILY 90 tablet 1  . fluticasone (FLONASE) 50 MCG/ACT nasal spray Place 2 sprays into both nostrils daily. 48 g 3  . ipratropium (ATROVENT) 0.03 % nasal spray Place 1-2 sprays into both nostrils 2 (two) times daily as needed (nasal drainage). 30 mL 3  . ketoconazole (NIZORAL) 2 % shampoo Apply 1 Application topically 2 (two) times a week. Until it clears up and then may use as needed. 120 mL 2  . pantoprazole (PROTONIX) 40 MG tablet Take 1 tablet (40 mg total) by mouth daily. 30 tablet 2   No current facility-administered medications for this visit.   Allergies: Allergies  Allergen Reactions  . Prozac [Fluoxetine Hcl] Hives   I reviewed his past medical history, social history, family  history, and environmental history and no significant changes have been reported from his previous visit.  Review of Systems  Constitutional:  Negative for appetite change, chills, fever and unexpected weight change.  HENT:  Negative for congestion, postnasal drip and rhinorrhea.   Eyes:  Negative for itching.  Respiratory:  Positive for cough. Negative for chest tightness, shortness of breath and wheezing.   Cardiovascular:  Negative for chest pain.  Gastrointestinal:  Negative for abdominal pain.  Genitourinary:  Negative for difficulty urinating.  Allergic/Immunologic: Negative for food allergies.  Neurological:  Negative for headaches.   Objective: There were no vitals taken for this visit. There is no height or weight on file to calculate BMI. Physical Exam Vitals and nursing note reviewed.  Constitutional:      Appearance: Normal appearance. He is well-developed.  HENT:     Head: Normocephalic and atraumatic.     Right Ear: Tympanic membrane and external ear normal.     Left Ear: Tympanic membrane and external ear normal.     Nose: Nose normal.  Eyes:     Conjunctiva/sclera: Conjunctivae normal.  Cardiovascular:     Rate and Rhythm: Normal rate and regular rhythm.     Heart sounds: Normal heart sounds. No murmur heard.    No friction rub. No gallop.  Pulmonary:     Effort: Pulmonary effort is normal.     Breath sounds: Normal breath sounds. No wheezing or rales.  Musculoskeletal:     Cervical back: Neck supple.  Skin:    General: Skin is warm.     Findings: No rash.  Neurological:     Mental Status: He is alert and oriented to person, place, and time.  Psychiatric:        Behavior: Behavior normal.  Previous notes and tests were reviewed. The plan was reviewed with the patient/family, and all questions/concerned were addressed.  It was my pleasure to see Abb today and participate in his care. Please feel free to contact me with any questions or  concerns.  Sincerely,  Wyline Mood, DO Allergy & Immunology  [facility] office: [phone] [facility] office: [phone]

## 2023-05-21 ENCOUNTER — Encounter: Payer: Self-pay | Admitting: Allergy

## 2023-05-21 ENCOUNTER — Ambulatory Visit: Payer: BC Managed Care – PPO | Admitting: Allergy

## 2023-05-21 VITALS — BP 120/72 | HR 56 | Temp 97.7°F | Resp 18

## 2023-05-21 DIAGNOSIS — J4540 Moderate persistent asthma, uncomplicated: Secondary | ICD-10-CM

## 2023-05-21 DIAGNOSIS — R053 Chronic cough: Secondary | ICD-10-CM

## 2023-05-21 DIAGNOSIS — J310 Chronic rhinitis: Secondary | ICD-10-CM

## 2023-05-21 DIAGNOSIS — K219 Gastro-esophageal reflux disease without esophagitis: Secondary | ICD-10-CM | POA: Diagnosis not present

## 2023-05-21 MED ORDER — IPRATROPIUM BROMIDE 0.03 % NA SOLN: 1.0000 | Freq: Two times a day (BID) | NASAL | 3 refills | Status: DC | PRN

## 2023-05-21 NOTE — Patient Instructions (Addendum)
Coughing Next time you have the coughing fits - take albuterol 2 puffs and see if it makes your symptoms go away faster.  Stop pantoprazole after you are done with it. Let me know if reflux symptoms worsen.   Use Atrovent (ipratropium) 0.03% 1-2 sprays per nostril twice a day as needed for runny nose/drainage. Try Trelegy 1 puff once a day for 2 weeks - sample given. This is a stronger inhaler than Breo.  If no improvement after 1 month then get chest X-ray.   Asthma: Daily controller medication(s): continue Breo 1 puff daily. Rinse mouth after each use.  Do 2 week trial of Trelegy as above. When you take the Trelegy stop Breo. May use albuterol rescue inhaler 2 puffs every 4 to 6 hours as needed for shortness of breath, chest tightness, coughing, and wheezing. May use albuterol rescue inhaler 2 puffs 5 to 15 minutes prior to strenuous physical activities. Monitor frequency of use - if you need to use it more than twice per week on a consistent basis let us know.  Asthma control goals:  Full participation in all desired activities (may need albuterol before activity) Albuterol use two times or less a week on average (not counting use with activity) Cough interfering with sleep two times or less a month Oral steroids no more than once a year No hospitalizations  Follow up in 2 months or sooner if needed.

## 2023-05-28 ENCOUNTER — Other Ambulatory Visit: Payer: Self-pay | Admitting: Allergy

## 2023-06-04 ENCOUNTER — Ambulatory Visit: Payer: BC Managed Care – PPO | Admitting: Podiatry

## 2023-06-04 ENCOUNTER — Ambulatory Visit (INDEPENDENT_AMBULATORY_CARE_PROVIDER_SITE_OTHER): Payer: BC Managed Care – PPO

## 2023-06-04 DIAGNOSIS — M79672 Pain in left foot: Secondary | ICD-10-CM

## 2023-06-04 DIAGNOSIS — M79671 Pain in right foot: Secondary | ICD-10-CM

## 2023-06-04 DIAGNOSIS — M722 Plantar fascial fibromatosis: Secondary | ICD-10-CM

## 2023-06-04 MED ORDER — MELOXICAM 15 MG PO TABS: 15.0000 mg | ORAL_TABLET | Freq: Every day | ORAL | 0 refills | Status: DC

## 2023-06-04 NOTE — Patient Instructions (Signed)

## 2023-06-04 NOTE — Progress Notes (Signed)
  Subjective:  Patient ID: Antonio House, male    DOB: [birth year],   MRN: [id number]  Chief Complaint  Patient presents with   Foot Pain    Pt presents for bil heel pain mostly right.    52 y.o. male presents for concern of bilateral heel pain that has been ongoing for several months. Relates first steps in the morning are the most painful and does get a little better during the day. He relates mostly pain in his right and some in the left. States he has tried some extra padding in his shoes to help but denies other treatments.   . Denies any other pedal complaints. Denies n/v/f/c.   Past Medical History:  Diagnosis Date   Asthma    Hx of migraines    Occular   Hyperlipidemia     Objective:  Physical Exam: Vascular: DP/PT pulses 2/4 bilateral. CFT <3 seconds. Normal hair growth on digits. No edema.  Skin. No lacerations or abrasions bilateral feet.  Musculoskeletal: MMT 5/5 bilateral lower extremities in DF, PF, Inversion and Eversion. Deceased ROM in DF of ankle joint. Tender to the medial calcaneal tubercle bilaterally . No pain with achilles, PT or arch. No pain with calcaneal squeeze.  Neurological: Sensation intact to light touch.   Assessment:   1. Plantar fasciitis, right   2. Plantar fasciitis, left      Plan:  Patient was evaluated and treated and all questions answered. Discussed plantar fasciitis with patient.  X-rays reviewed and discussed with patient. No acute fractures or dislocations noted. Mild spurring noted at inferior calcaneus.  Discussed treatment options including, ice, NSAIDS, supportive shoes, bracing, and stretching. Stretching exercises provided to be done on a daily basis.   Prescription for meloxicam provided and sent to pharmacy. Most recent kidney function labs wnl.  PF brace dispensed.  Follow-up 6 weeks or sooner if any problems arise. In the meantime, encouraged to call the office with any questions, concerns, change in symptoms.       Louann Sjogren, DPM

## 2023-06-11 ENCOUNTER — Encounter: Payer: BC Managed Care – PPO | Admitting: Family Medicine

## 2023-07-11 DIAGNOSIS — R053 Chronic cough: Secondary | ICD-10-CM | POA: Diagnosis not present

## 2023-07-15 ENCOUNTER — Other Ambulatory Visit: Payer: Self-pay | Admitting: Allergy

## 2023-07-22 ENCOUNTER — Ambulatory Visit: Payer: BC Managed Care – PPO | Admitting: Podiatry

## 2023-07-22 ENCOUNTER — Encounter: Payer: Self-pay | Admitting: Podiatry

## 2023-07-22 DIAGNOSIS — M722 Plantar fascial fibromatosis: Secondary | ICD-10-CM | POA: Diagnosis not present

## 2023-07-22 MED ORDER — MELOXICAM 15 MG PO TABS: 15.0000 mg | ORAL_TABLET | Freq: Every day | ORAL | 0 refills | Status: DC

## 2023-07-22 NOTE — Progress Notes (Signed)
  Subjective:  Patient ID: Antonio House, male    DOB: July 25, 1971,   MRN: [id number]  No chief complaint on file.   52 y.o. male presents for follow-up of plantar fascitis mostly on the right. Relates the meloxicam really helped but when he didn't take it the pain would return. The brace helps. Has not really been stretching.    . Denies any other pedal complaints. Denies n/v/f/c.   Past Medical History:  Diagnosis Date   Asthma    Hx of migraines    Occular   Hyperlipidemia     Objective:  Physical Exam: Vascular: DP/PT pulses 2/4 bilateral. CFT <3 seconds. Normal hair growth on digits. No edema.  Skin. No lacerations or abrasions bilateral feet.  Musculoskeletal: MMT 5/5 bilateral lower extremities in DF, PF, Inversion and Eversion. Deceased ROM in DF of ankle joint. Tender to the medial calcaneal tubercle bilaterally . No pain with achilles, PT or arch. No pain with calcaneal squeeze.  Neurological: Sensation intact to light touch.   Assessment:   1. Plantar fasciitis, right   2. Plantar fasciitis, left      Plan:  Patient was evaluated and treated and all questions answered. Discussed plantar fasciitis with patient.  X-rays reviewed and discussed with patient. No acute fractures or dislocations noted. Mild spurring noted at inferior calcaneus.  Discussed treatment options including, ice, NSAIDS, supportive shoes, bracing, and stretching. Stretching exercises provided to be done on a daily basis.   Cotinue brace and meloxicam refilled  Discussed CMO and will work on these.  Follow-up as needed     Louann Sjogren, DPM

## 2023-07-22 NOTE — Patient Instructions (Signed)

## 2023-08-06 ENCOUNTER — Ambulatory Visit (INDEPENDENT_AMBULATORY_CARE_PROVIDER_SITE_OTHER): Payer: BC Managed Care – PPO | Admitting: *Deleted

## 2023-08-06 ENCOUNTER — Telehealth: Payer: Self-pay | Admitting: *Deleted

## 2023-08-06 ENCOUNTER — Ambulatory Visit: Payer: BC Managed Care – PPO | Admitting: Allergy

## 2023-08-06 ENCOUNTER — Encounter: Payer: Self-pay | Admitting: Allergy

## 2023-08-06 VITALS — BP 130/86 | HR 55 | Temp 98.0°F | Resp 18 | Wt 253.0 lb

## 2023-08-06 DIAGNOSIS — R55 Syncope and collapse: Secondary | ICD-10-CM | POA: Diagnosis not present

## 2023-08-06 DIAGNOSIS — J4540 Moderate persistent asthma, uncomplicated: Secondary | ICD-10-CM | POA: Diagnosis not present

## 2023-08-06 DIAGNOSIS — J310 Chronic rhinitis: Secondary | ICD-10-CM | POA: Diagnosis not present

## 2023-08-06 DIAGNOSIS — R053 Chronic cough: Secondary | ICD-10-CM

## 2023-08-06 MED ORDER — TRELEGY ELLIPTA 200-62.5-25 MCG/ACT IN AEPB: 1.0000 | INHALATION_SPRAY | Freq: Every day | RESPIRATORY_TRACT | 5 refills | Status: AC

## 2023-08-06 NOTE — Patient Instructions (Addendum)
 Passing out Please follow up with PCP regarding this. Recommend going to Urgent Care or ER as you are still feeling not back to yourself. Your blood pressure and pulse were within the normal range.   Coughing Next time you have the coughing fits - take albuterol  2 puffs and see if it makes your symptoms go away faster. Use Atrovent  (ipratropium) 0.03% 1-2 sprays per nostril twice a day as needed for runny nose/drainage. Keep ENT appointment to get CT.   Asthma: Daily controller medication(s): start Trelegy 200mcg 1 puff daily. Rinse mouth after each use. Samples given.  Stop Breo.  May use albuterol  rescue inhaler 2 puffs every 4 to 6 hours as needed for shortness of breath, chest tightness, coughing, and wheezing. May use albuterol  rescue inhaler 2 puffs 5 to 15 minutes prior to strenuous physical activities. Monitor frequency of use - if you need to use it more than twice per week on a consistent basis let us  know.  Asthma control goals:  Full participation in all desired activities (may need albuterol  before activity) Albuterol  use two times or less a week on average (not counting use with activity) Cough interfering with sleep two times or less a month Oral steroids no more than once a year No hospitalizations  Follow up in 1-2 months or sooner if needed.

## 2023-08-06 NOTE — Progress Notes (Signed)
 Follow Up Note  RE: Antonio House MRN: [id number] DOB: March 17, 1971 Date of Office Visit: 08/06/2023  Referring provider: Jordan, Betty G, MD Primary care provider: Jordan, Betty G, MD  Chief Complaint: Follow-up (Is still having throat clearing, still coughing(dry), nasal sprays are not helping) and Medication Refill (Nizoral  Shampoo)  History of Present Illness: I had the pleasure of seeing Antonio House for a follow up visit at the [facility] on 08/06/2023. He is a 53 y.o. male, who is being followed for asthma, cough, chronic rhinitis, GERD. His previous allergy office visit was on 05/21/2023 with Dr. Luke. Today is a regular follow up visit.   He is accompanied today by his spouse at the end of visit due to his syncopal episode - who provided/contributed to the history.   Discussed the use of AI scribe software for clinical note transcription with the patient, who gave verbal consent to proceed.  The patient, a 53 year old individual with NO known history of cardiac disease, diabetes or syncope, had an episode of syncope during spirometry. The patient reported feeling fine prior to the episode, with no symptoms of chest pain or dizziness. The patient's blood pressure was noted to be lower than usual at 108/78, which was unusual for him. The patient regained consciousness within a few seconds after the episode but reported feeling nauseated and not quite back to his usual state of health. He states that he feels warm and looks pale. He did improve over time.   The patient also reported a persistent issue of throat clearing and coughing. He had seen an ear, nose, and throat specialist who had prescribed Singulair . The patient was also scheduled for a CT scan. The patient had previously been on pantoprazole  for suspected reflux, but did not notice any difference in symptoms after discontinuing the medication. The patient did not report any heartburn or typical reflux  symptoms.  The patient had tried various treatments including a nasal spray and an inhaler, but none had provided significant relief. The patient reported some improvement with the use of Trelegy. The patient had a chest x-ray last year, but not recently. The patient did not report any side effects from the Singulair , which was the only new medication he had started recently.     07/11/2023 ENT visit: Impression & Plans:   1) cough 2) nasal septal deviation  Trial of Singulair  Recommended dietary and lifestyle changes just in case there is a component of reflux we will plan to recheck 6 to 8 weeks   Assessment and Plan: Antonio House is a 53 y.o. male with: Vasovagal syncope Episode of syncope during spirometry lasting a few seconds. No history of cardiac disease, diabetes, or syncope. Patient recovered but still not feeling 100%.  Wife was called and she came to the office as we recommended that he does not drive as he was still not feeling himself.  Repeat vitals were stable. He usually does have a low resting HR in the 50s.  Recommended urgent care or ER visit for further evaluation - offered to call EMS as well but patient states that he is feeling better.  Follow up with PCP.   Moderate persistent asthma without complication Chronic cough Past history - Diagnosed with asthma over 40 years ago. Tried Advair and Symbicort .  Main triggers are unknown but exertion makes things worse.  2020 spirometry showed mild obstruction with 8% improvement in FEV1 post bronchodilator treatment.  Clinically also feeling better. Normal CXR in  2024.  Interim history - Persistent despite trials of reflux medication, nasal spray, and inhalers. Recent ENT visit with endoscopy showed no abnormalities. Patient scheduled for CT scan of sinuses. May had slight improvement with Trelegy. Today's spirometry showed some mild obstruction but only did 1 exhalation due to above vasovagal syncopal episode.  Daily  controller medication(s): start Trelegy 200mcg 1 puff daily. Rinse mouth after each use. Samples given.  Stop Breo.  May use albuterol  rescue inhaler 2 puffs every 4 to 6 hours as needed for shortness of breath, chest tightness, coughing, and wheezing. May use albuterol  rescue inhaler 2 puffs 5 to 15 minutes prior to strenuous physical activities. Monitor frequency of use - if you need to use it more than twice per week on a consistent basis let us  know.  Next time you have the coughing fits - take albuterol  2 puffs and see if it makes your symptoms go away faster. Use Atrovent  (ipratropium) 0.03% 1-2 sprays per nostril twice a day as needed for runny nose/drainage. Keep ENT appointment to get CT.   Chronic rhinitis Past history - Postnasal drip and throat clearing for 15 years. Tried Nasonex inconsistently and Protonix  with unknown benefit.  No recent ENT evaluation.  Skin testing over 10 years ago was negative per patient report. Flonase  ineffective. Azelastine  caused sneezing.  Use Atrovent  (ipratropium) 0.03% 1-2 sprays per nostril twice a day as needed for runny nose/drainage.  Follow up in 1-2 months or sooner if needed.   Meds ordered this encounter  Medications   Fluticasone -Umeclidin-Vilant (TRELEGY ELLIPTA ) 200-62.5-25 MCG/ACT AEPB    Sig: Inhale 1 puff into the lungs daily. Rinse mouth after each use.    Dispense:  60 each    Refill:  5   Lab Orders  No laboratory test(s) ordered today    Diagnostics: Spirometry:  Tracings reviewed. His effort:  patient was only able to do 1 exhalation as he had a brief syncopal episode. FVC: 5.28L FEV1: 3.46L, 76% predicted FEV1/FVC ratio: 66% Interpretation: Spirometry consistent with mild obstructive disease.  Please see scanned spirometry results for details. Results discussed with patient/family.   Medication List:  Current Outpatient Medications  Medication Sig Dispense Refill   albuterol  (VENTOLIN  HFA) 108 (90 Base) MCG/ACT  inhaler Inhale 2 puffs into the lungs every 4 (four) hours as needed for wheezing or shortness of breath (coughing fits). 18 g 1   citalopram  (CELEXA ) 40 MG tablet TAKE 1 TABLET(40 MG) BY MOUTH DAILY 90 tablet 1   fluticasone  (FLONASE ) 50 MCG/ACT nasal spray Place 2 sprays into both nostrils daily. 48 g 3   Fluticasone -Umeclidin-Vilant (TRELEGY ELLIPTA ) 200-62.5-25 MCG/ACT AEPB Inhale 1 puff into the lungs daily. Rinse mouth after each use. 60 each 5   ipratropium (ATROVENT ) 0.03 % nasal spray PLACE 1-2 SPRAYS INTO BOTH NOSTRILS 2 (TWO) TIMES DAILY AS NEEDED (NASAL DRAINAGE). 30 mL 1   ketoconazole  (NIZORAL ) 2 % shampoo Apply 1 Application topically 2 (two) times a week. Until it clears up and then may use as needed. 120 mL 2   montelukast  (SINGULAIR ) 10 MG tablet Take 1 tablet by mouth at bedtime.     No current facility-administered medications for this visit.   Allergies: Allergies  Allergen Reactions   Prozac [Fluoxetine Hcl] Hives   I reviewed his past medical history, social history, family history, and environmental history and no significant changes have been reported from his previous visit.  Review of Systems  Constitutional:  Negative for appetite change, chills, fever and  unexpected weight change.  HENT:  Negative for congestion, postnasal drip and rhinorrhea.   Eyes:  Negative for itching.  Respiratory:  Positive for cough. Negative for chest tightness, shortness of breath and wheezing.   Cardiovascular:  Negative for chest pain.  Gastrointestinal:  Negative for abdominal pain.  Genitourinary:  Negative for difficulty urinating.  Allergic/Immunologic: Negative for food allergies.  Neurological:  Positive for syncope. Negative for headaches.    Objective: BP 130/86   Pulse (!) 55   Temp 98 F (36.7 C)   Resp 18   Wt 253 lb (114.8 kg)   SpO2 96%   BMI 31.21 kg/m  Body mass index is 31.21 kg/m. Physical Exam Vitals and nursing note reviewed.  Constitutional:       Appearance: He is well-developed.     Comments: Pallor on face  HENT:     Head: Normocephalic and atraumatic.     Right Ear: Tympanic membrane and external ear normal.     Left Ear: Tympanic membrane and external ear normal.     Nose: Nose normal.  Eyes:     Conjunctiva/sclera: Conjunctivae normal.  Cardiovascular:     Rate and Rhythm: Normal rate and regular rhythm.     Heart sounds: Normal heart sounds. No murmur heard.    No friction rub. No gallop.  Pulmonary:     Effort: Pulmonary effort is normal.     Breath sounds: Normal breath sounds. No wheezing or rales.  Musculoskeletal:     Cervical back: Neck supple.  Skin:    General: Skin is warm.     Findings: No rash.  Neurological:     Mental Status: He is alert and oriented to person, place, and time.  Psychiatric:        Behavior: Behavior normal.   Previous notes and tests were reviewed. The plan was reviewed with the patient/family, and all questions/concerned were addressed.  It was my pleasure to see Antonio House today and participate in his care. Please feel free to contact me with any questions or concerns.  Sincerely,  Orlan Cramp, DO Allergy & Immunology  [facility]  [place] office: [phone] [facility] office: [phone]

## 2023-08-06 NOTE — Telephone Encounter (Signed)
 Pt wife called and stated that pt passed out in asthma and allergy today.  His pcp recommended a EKG.  Per Kim EKG ok to do if provider signs off here. Dr. Laury Axon will sign off so we can send to Dr. [place].  Pt put on nurse schedule.

## 2023-08-06 NOTE — Progress Notes (Signed)
 Patient here for EKG per physicians order.  Patient had a syncopal episode at his follow up appointment allergy and asthma.

## 2023-08-07 ENCOUNTER — Telehealth: Payer: Self-pay

## 2023-08-07 ENCOUNTER — Ambulatory Visit: Payer: BC Managed Care – PPO

## 2023-08-07 DIAGNOSIS — J320 Chronic maxillary sinusitis: Secondary | ICD-10-CM | POA: Diagnosis not present

## 2023-08-07 DIAGNOSIS — J342 Deviated nasal septum: Secondary | ICD-10-CM | POA: Diagnosis not present

## 2023-08-07 DIAGNOSIS — R053 Chronic cough: Secondary | ICD-10-CM | POA: Diagnosis not present

## 2023-08-07 NOTE — Telephone Encounter (Signed)
-----   Message from Antonio House sent at 08/06/2023 10:46 PM EST ----- Please call patient on 1/8 to see how he is doing. Thank you.

## 2023-08-07 NOTE — Telephone Encounter (Signed)
Error patient has already been called.

## 2023-08-07 NOTE — Telephone Encounter (Signed)
 Called patient to check on him after his visit in our clinic yesterday. While doing a spirometry he passed out and was very flushed. He waited in the office for an additional hour with a fan blowing on him. His wife came in to assist him getting home. He stated they went [facility] and an EKG was done and it was normal. He stated he feels fine today.  Sayan [phone]

## 2023-08-13 NOTE — Progress Notes (Signed)
 Virtual Visit via Video Note I connected with Antonio House on 08/14/2023 by a video enabled telemedicine application and verified that I am speaking with the correct person using two identifiers. Location patient: home Location provider:work office Persons participating in the virtual visit: patient, scriber,provider  I discussed the limitations of evaluation and management by telemedicine and the availability of in person appointments. The patient expressed understanding and agreed to proceed.  Chief Complaint  Patient presents with   Referral   Medical Management of Chronic Issues   HPI:  [name] is a 53 y.o. male with a PMHx significant for OCD, migraine, HLD, and asthma who is being seen on video today for medication follow up and requesting a cardiology referral.    He was last seen on 05/16/24, since then he has followed with immunologist and ENT.  -On 08/06/23 while at his immunologist's office he reports having a vasovagal episode. It happens while doing a spirometry, lasted a few seconds. No prior hx of syncope. Had an EKG at his wife's office that evening, which showed sinus bradycardia.  He mentions his systolic BP was 108 prior to the pulmonary function test.  Hx of sinus bradycardia. He denies any chest pain, SOB,diaphoresis, or palpitations on exertion.  He uses a Albuterol  inhaler before going for a run and has not problems with endurance.   He also mentions that he has a FMHx of heart valve abnormality, and at a recent appointment, his father's cardiologist suggested that his father's children should get checked. No FMHx of sudden death at young age.  OCD and anxiety:  Currently on citalopram  40 mg daily. He believes the medication is helping.  Negative for depression like symptoms.  Cough:  Patient also mentions he has had a persistent cough for over a year.  He has followed with his immunologist several times, as well as ENT.  Currently taking  an antibiotic for left maxillary sinusitis.   Denies any recent heartburn. He notes he was on protonix  in the past, he took it for a couple months and did not notice improvement of cough. Reports having laryngoscopy done at his ENT's office and was negative for significant abnormalities. No associated wheezing. He has not identified exacerbating or alleviating factors.  ROS: See pertinent positives and negatives per HPI.  Past Medical History:  Diagnosis Date   Asthma    Hx of migraines    Occular   Hyperlipidemia     Past Surgical History:  Procedure Laterality Date   WISDOM TOOTH EXTRACTION      Family History  Problem Relation Age of Onset   Migraines Mother    Cancer Mother    Early death Mother    Hypertension Mother    COPD Father    Heart disease Father    Migraines Sister    Migraines Sister    Stroke Maternal Grandmother    Alcohol abuse Maternal Grandfather    Birth defects Maternal Grandfather    Early death Maternal Grandfather    Heart disease Paternal Grandmother    Stroke Paternal Grandfather     Social History   Socioeconomic History   Marital status: Married    Spouse name: Not on file   Number of children: Not on file   Years of education: Not on file   Highest education level: Not on file  Occupational History   Not on file  Tobacco Use   Smoking status: Never   Smokeless tobacco: Never  Vaping  Use   Vaping status: Never Used  Substance and Sexual Activity   Alcohol use: Yes   Drug use: Never   Sexual activity: Yes    Partners: Female  Other Topics Concern   Not on file  Social History Narrative   Not on file   Social Drivers of Health   Financial Resource Strain: Not on file  Food Insecurity: Not on file  Transportation Needs: Not on file  Physical Activity: Not on file  Stress: Not on file  Social Connections: Not on file  Intimate Partner Violence: Not on file    Current Outpatient Medications:    albuterol  (VENTOLIN   HFA) 108 (90 Base) MCG/ACT inhaler, Inhale 2 puffs into the lungs every 4 (four) hours as needed for wheezing or shortness of breath (coughing fits)., Disp: 18 g, Rfl: 1   fluticasone  (FLONASE ) 50 MCG/ACT nasal spray, Place 2 sprays into both nostrils daily., Disp: 48 g, Rfl: 3   Fluticasone -Umeclidin-Vilant (TRELEGY ELLIPTA ) 200-62.5-25 MCG/ACT AEPB, Inhale 1 puff into the lungs daily. Rinse mouth after each use., Disp: 60 each, Rfl: 5   ipratropium (ATROVENT ) 0.03 % nasal spray, PLACE 1-2 SPRAYS INTO BOTH NOSTRILS 2 (TWO) TIMES DAILY AS NEEDED (NASAL DRAINAGE)., Disp: 30 mL, Rfl: 1   ketoconazole  (NIZORAL ) 2 % shampoo, Apply 1 Application topically 2 (two) times a week. Until it clears up and then may use as needed., Disp: 120 mL, Rfl: 2   montelukast  (SINGULAIR ) 10 MG tablet, Take 1 tablet by mouth at bedtime., Disp: , Rfl:    citalopram  (CELEXA ) 40 MG tablet, TAKE 1 TABLET(40 MG) BY MOUTH DAILY, Disp: 90 tablet, Rfl: 3  EXAM:  VITALS per patient if applicable:Ht 6' 3.5" (1.918 m)   BMI 31.21 kg/m   GENERAL: alert, oriented, appears well and in no acute distress  HEENT: atraumatic, conjunctiva clear, no obvious abnormalities on inspection of external nose and ears  NECK: normal movements of the head and neck  LUNGS: on inspection no signs of respiratory distress, breathing rate appears normal, no obvious gross SOB, gasping or wheezing  CV: no obvious cyanosis  MS: moves all visible extremities without noticeable abnormality  PSYCH/NEURO: pleasant and cooperative, no obvious depression or anxiety, speech and thought processing grossly intact  ASSESSMENT AND PLAN:  Discussed the following assessment and plan:  Vasovagal episode We discussed possible etiologies, no prior hx. Reports some type of valve abnormality his father was recently dx with and was recommended for children to be screened. Adequate hydration. Recommend stopping exercising for now and until he sees  cardiologist.  -     Ambulatory referral to Cardiology  Obsessive-compulsive disorder, unspecified type Assessment & Plan: Currently on Celexa  40 mg daily. Problem is well-controlled with current management, he would like to continue pharmacologic treatment. Annual follow-up can be continued as far as problem is stable.  Orders: -     Citalopram  Hydrobromide; TAKE 1 TABLET(40 MG) BY MOUTH DAILY  Dispense: 90 tablet; Refill: 3  Chronic cough Assessment & Plan: Prior treatments have not helped, he has tried a couple months of PPI (Protonix ), currently on asthma treatment and Atrovent  nasal spray. Currently he is following with immunologist and ENT.  Sinus bradycardia Assessment & Plan: Chronic. EKG done on 08/06/2023 sinus bradycardia,LAD, ? IVCD, and unspecific T wave abnormalities. No other EKG for comparison. Appointment with cardiologist will be arranged.  We discussed possible serious and likely etiologies, options for evaluation and workup, limitations of telemedicine visit vs in person visit, treatment, treatment  risks and precautions. The patient was advised to call back or seek an in-person evaluation if the symptoms worsen or if the condition fails to improve as anticipated. I discussed the assessment and treatment plan with the patient. The patient was provided an opportunity to ask questions and all were answered. The patient agreed with the plan and demonstrated an understanding of the instructions.  Return if symptoms worsen or fail to improve.  I, Fritz Jewel Wierda, acting as a scribe for Jayvyn Haselton [place], MD., have documented all relevant documentation on the behalf of Nasser Ku [place], MD, as directed by  Koni Kannan [place], MD while in the presence of Lodema Parma [place], MD.   I, Maclain Cohron [place], MD, have reviewed all documentation for this visit. The documentation on 08/14/23 for the exam, diagnosis, procedures, and orders are all accurate and complete.  Hiro Vipond [place], MD

## 2023-08-14 ENCOUNTER — Encounter: Payer: Self-pay | Admitting: Family Medicine

## 2023-08-14 ENCOUNTER — Telehealth (INDEPENDENT_AMBULATORY_CARE_PROVIDER_SITE_OTHER): Payer: BC Managed Care – PPO | Admitting: Family Medicine

## 2023-08-14 VITALS — Ht 75.5 in

## 2023-08-14 DIAGNOSIS — R001 Bradycardia, unspecified: Secondary | ICD-10-CM | POA: Diagnosis not present

## 2023-08-14 DIAGNOSIS — R55 Syncope and collapse: Secondary | ICD-10-CM | POA: Diagnosis not present

## 2023-08-14 DIAGNOSIS — F429 Obsessive-compulsive disorder, unspecified: Secondary | ICD-10-CM | POA: Diagnosis not present

## 2023-08-14 DIAGNOSIS — R053 Chronic cough: Secondary | ICD-10-CM | POA: Diagnosis not present

## 2023-08-14 MED ORDER — CITALOPRAM HYDROBROMIDE 40 MG PO TABS: ORAL_TABLET | ORAL | 3 refills | Status: DC

## 2023-08-14 NOTE — Assessment & Plan Note (Signed)
 Prior treatments have not helped, he has tried a couple months of PPI (Protonix ), currently on asthma treatment and Atrovent  nasal spray. Currently he is following with immunologist and ENT.

## 2023-08-14 NOTE — Assessment & Plan Note (Addendum)
 Currently on Celexa  40 mg daily. Problem is well-controlled with current management, he would like to continue pharmacologic treatment. Annual follow-up can be continued as far as problem is stable.

## 2023-08-14 NOTE — Assessment & Plan Note (Signed)
 Chronic. EKG done on 08/06/2023 sinus bradycardia,LAD, ? IVCD, and unspecific T wave abnormalities. No other EKG for comparison. Appointment with cardiologist will be arranged.

## 2023-08-16 ENCOUNTER — Telehealth: Payer: Self-pay | Admitting: *Deleted

## 2023-08-16 NOTE — Telephone Encounter (Signed)
HI Antonio House, I don't know if this patient experienced  a syncopal episode after you reviewed charts.  He is awaiting Cardiology appt.Marland KitchenMarland KitchenJust wanted to confirm with you .   Thank you

## 2023-08-18 ENCOUNTER — Other Ambulatory Visit: Payer: Self-pay | Admitting: Family

## 2023-08-18 DIAGNOSIS — L219 Seborrheic dermatitis, unspecified: Secondary | ICD-10-CM

## 2023-08-20 ENCOUNTER — Telehealth: Payer: Self-pay | Admitting: *Deleted

## 2023-08-20 NOTE — Telephone Encounter (Signed)
Attempt to reach pt to inform him that he will need cardiac clearance before he can have a colonoscopy per CRNA. Unable to LM due to VM being full. Will attempt the other home phone oin profile.  Reached pt's wife who was on the phone with pt. Gave call back # for pt to call RN back. Recalled 1st # and reached pt. Informed him that due to Anesthesia review he will need cardiac clearance before having procedure. Gave main office # for him to reschedule once clearance had been obtained.

## 2023-08-27 NOTE — Telephone Encounter (Signed)
Both procedure and PV has been canceled.

## 2023-08-27 NOTE — Telephone Encounter (Signed)
Call to clarify if pt has canceled PV and procedure to obtain Cardiac Clearance. Pt states he has./

## 2023-08-29 ENCOUNTER — Other Ambulatory Visit (HOSPITAL_COMMUNITY): Payer: Self-pay

## 2023-09-04 DIAGNOSIS — R053 Chronic cough: Secondary | ICD-10-CM | POA: Diagnosis not present

## 2023-09-04 DIAGNOSIS — J320 Chronic maxillary sinusitis: Secondary | ICD-10-CM | POA: Diagnosis not present

## 2023-09-04 DIAGNOSIS — J342 Deviated nasal septum: Secondary | ICD-10-CM | POA: Diagnosis not present

## 2023-09-17 DIAGNOSIS — F429 Obsessive-compulsive disorder, unspecified: Secondary | ICD-10-CM | POA: Diagnosis not present

## 2023-09-17 DIAGNOSIS — F411 Generalized anxiety disorder: Secondary | ICD-10-CM | POA: Diagnosis not present

## 2023-09-23 ENCOUNTER — Encounter: Payer: BC Managed Care – PPO | Admitting: Pediatrics

## 2023-10-14 DIAGNOSIS — F411 Generalized anxiety disorder: Secondary | ICD-10-CM | POA: Diagnosis not present

## 2023-10-14 DIAGNOSIS — F429 Obsessive-compulsive disorder, unspecified: Secondary | ICD-10-CM | POA: Diagnosis not present

## 2023-10-28 ENCOUNTER — Encounter: Payer: Self-pay | Admitting: Internal Medicine

## 2023-11-04 ENCOUNTER — Ambulatory Visit: Admitting: Internal Medicine

## 2023-11-07 ENCOUNTER — Ambulatory Visit: Admitting: Podiatry

## 2023-11-07 ENCOUNTER — Encounter: Payer: Self-pay | Admitting: Podiatry

## 2023-11-07 DIAGNOSIS — M722 Plantar fascial fibromatosis: Secondary | ICD-10-CM | POA: Diagnosis not present

## 2023-11-07 NOTE — Patient Instructions (Addendum)

## 2023-11-08 ENCOUNTER — Ambulatory Visit: Admitting: Internal Medicine

## 2023-11-11 ENCOUNTER — Encounter: Payer: Self-pay | Admitting: Podiatry

## 2023-11-11 NOTE — Progress Notes (Signed)
       Chief Complaint  Patient presents with   Foot Pain    " I have been having pain in the right heel and the pain is not getting any better since last visit,, I have been taking meloxicam and helps some and some orthotic inserts helps a little bit"    HPI: 53 y.o. male presenting today with c/o pain in the bottom of the right heel.  He states that he had been seeing Dr. Sikora for this.  He has not had significant relief from last visit.  He has been taking more meloxicam and using over-the-counter inserts with minimal relief  Past Medical History:  Diagnosis Date   Asthma    Hx of migraines    Occular   Hyperlipidemia     Past Surgical History:  Procedure Laterality Date   WISDOM TOOTH EXTRACTION      Allergies  Allergen Reactions   Prozac [Fluoxetine Hcl] Hives     Physical Exam: General: The patient is alert and oriented x3 in no acute distress.  Dermatology:  No ecchymosis, erythema, or edema bilateral.  No open lesions.    Vascular: Palpable pedal pulses bilaterally. Capillary refill within normal limits.  No appreciable edema.    Neurological: Light touch sensation intact bilateral.  MMT 5/5 to lower extremity bilateral. Negative Tinel's sign with percussion of the posterior tibial nerve on the affected extremity.    Musculoskeletal Exam:  There is pain on palpation of the plantarmedial & plantarcentral aspect of right heel.  No gaps or nodules within the plantar fascia.  Positive Windlass mechanism bilateral.  Antalgic gait noted with first few steps upon standing.  No pain on palpation of achilles tendon bilateral.  Ankle df less than 10 degrees with knee extended b/l.  Assessment/Plan of Care: 1. Plantar fasciitis, right     No orders of the defined types were placed in this encounter.  None  -Reviewed etiology of plantar fasciitis with patient.  Discussed treatment options with patient today, including cortisone injection, NSAID course of treatment,  stretching exercises, physical therapy, use of night splint, rest, icing the heel, arch supports/orthotics, and supportive shoe gear.    With the patient's verbal consent, a corticosteroid injection was administered to the right heel, consisting of a mixture of 1% lidocaine plain, 0.5% Sensorcaine plain, and Kenalog-10 for a total of 1.5cc administered.  A Band-aid was applied.  Patient tolerated this well.  Recommend that he follow-up with Dr. Sikora in about 3 weeks.  Recommended better quality inserts such as power steps or Superfeet.  Did also discuss plantar fascial stretching exercises at length.  Return in about 3 weeks (around 11/28/2023) for Plantar Fasciitis.   Shakti Fleer L. Lunda Salines, AACFAS [facility]     [address] [place], [place] [address]                Office [phone]  Fax [phone]

## 2023-11-27 ENCOUNTER — Ambulatory Visit (INDEPENDENT_AMBULATORY_CARE_PROVIDER_SITE_OTHER): Admitting: Podiatry

## 2023-11-27 DIAGNOSIS — Z91199 Patient's noncompliance with other medical treatment and regimen due to unspecified reason: Secondary | ICD-10-CM

## 2023-11-27 NOTE — Progress Notes (Signed)
 No show

## 2023-12-06 ENCOUNTER — Other Ambulatory Visit: Payer: Self-pay | Admitting: Allergy

## 2023-12-09 DIAGNOSIS — F429 Obsessive-compulsive disorder, unspecified: Secondary | ICD-10-CM | POA: Diagnosis not present

## 2023-12-09 DIAGNOSIS — F411 Generalized anxiety disorder: Secondary | ICD-10-CM | POA: Diagnosis not present

## 2023-12-20 ENCOUNTER — Ambulatory Visit: Attending: Cardiology | Admitting: Cardiology

## 2023-12-20 ENCOUNTER — Ambulatory Visit

## 2023-12-20 ENCOUNTER — Encounter: Payer: Self-pay | Admitting: Cardiology

## 2023-12-20 VITALS — BP 124/80 | HR 54 | Ht 74.0 in | Wt 249.0 lb

## 2023-12-20 DIAGNOSIS — J4540 Moderate persistent asthma, uncomplicated: Secondary | ICD-10-CM | POA: Diagnosis not present

## 2023-12-20 DIAGNOSIS — R55 Syncope and collapse: Secondary | ICD-10-CM | POA: Insufficient documentation

## 2023-12-20 DIAGNOSIS — E782 Mixed hyperlipidemia: Secondary | ICD-10-CM

## 2023-12-20 DIAGNOSIS — R001 Bradycardia, unspecified: Secondary | ICD-10-CM

## 2023-12-20 DIAGNOSIS — R0609 Other forms of dyspnea: Secondary | ICD-10-CM

## 2023-12-20 NOTE — Patient Instructions (Addendum)
 Medication Instructions:  Your physician recommends that you continue on your current medications as directed. Please refer to the Current Medication list given to you today.  *If you need a refill on your cardiac medications before your next appointment, please call your pharmacy*   Lab Work: None Ordered If you have labs (blood work) drawn today and your tests are completely normal, you will receive your results only by: MyChart Message (if you have MyChart) OR A paper copy in the mail If you have any lab test that is abnormal or we need to change your treatment, we will call you to review the results.   Testing/Procedures: Your physician has requested that you have an echocardiogram. Echocardiography is a painless test that uses sound waves to create images of your heart. It provides your doctor with information about the size and shape of your heart and how well your heart's chambers and valves are working. This procedure takes approximately one hour. There are no restrictions for this procedure. Please do NOT wear cologne, perfume, aftershave, or lotions (deodorant is allowed). Please arrive 15 minutes prior to your appointment time.  Please note: We ask at that you not bring children with you during ultrasound (echo/ vascular) testing. Due to room size and safety concerns, children are not allowed in the ultrasound rooms during exams. Our front office staff cannot provide observation of children in our lobby area while testing is being conducted. An adult accompanying a patient to their appointment will only be allowed in the ultrasound room at the discretion of the ultrasound technician under special circumstances. We apologize for any inconvenience.    WHY IS MY DOCTOR PRESCRIBING ZIO? The Zio system is proven and trusted by physicians to detect and diagnose irregular heart rhythms -- and has been prescribed to hundreds of thousands of patients.  The FDA has cleared the Zio system to  monitor for many different kinds of irregular heart rhythms. In a study, physicians were able to reach a diagnosis 90% of the time with the Zio system1.  You can wear the Zio monitor -- a small, discreet, comfortable patch -- during your normal day-to-day activity, including while you sleep, shower, and exercise, while it records every single heartbeat for analysis.  1Barrett, P., et al. Comparison of 24 Hour Holter Monitoring Versus 14 Day Novel Adhesive Patch Electrocardiographic Monitoring. American Journal of Medicine, 2014.  ZIO VS. HOLTER MONITORING The Zio monitor can be comfortably worn for up to 14 days. Holter monitors can be worn for 24 to 48 hours, limiting the time to record any irregular heart rhythms you may have. Zio is able to capture data for the 51% of patients who have their first symptom-triggered arrhythmia after 48 hours.1  LIVE WITHOUT RESTRICTIONS The Zio ambulatory cardiac monitor is a small, unobtrusive, and water-resistant patch--you might even forget you're wearing it. The Zio monitor records and stores every beat of your heart, whether you're sleeping, working out, or showering.    We will order CT coronary calcium score. It will cost $99.00 and is not covered by insurance.  Please call to schedule.    [facility] [address] [place], [place] [address] [phone]     Follow-Up: At [facility], you and your health needs are our priority.  As part of our continuing mission to provide you with exceptional heart care, we have created designated Provider Care Teams.  These Care Teams include your primary Cardiologist (physician) and Advanced Practice Providers (APPs -  Physician Assistants and Nurse Practitioners) who all work together to provide you with the care you need, when you need it.  We recommend signing up for the patient portal called "MyChart".  Sign up information is provided on this After Visit Summary.  MyChart is used to connect  with patients for Virtual Visits (Telemedicine).  Patients are able to view lab/test results, encounter notes, upcoming appointments, etc.  Non-urgent messages can be sent to your provider as well.   To learn more about what you can do with MyChart, go to [email or web address].    Your next appointment:   2 month(s)  The format for your next appointment:   In Person  Provider:   Gypsy Balsam, MD    Other Instructions NA

## 2023-12-20 NOTE — Progress Notes (Unsigned)
 Cardiology Consultation:    Date:  12/20/2023   ID:  Antonio House, DOB 1970/08/23, MRN [id number]  PCP:  [place], Betty G, MD  Cardiologist:  Ralene Burger, MD   Referring MD: [place], Betty G, MD   Chief Complaint  Patient presents with   Loss of Consciousness    History of Present Illness:    Antonio House is a 53 y.o. male who is being seen today for the evaluation of syncope at the request of [place], Theotis Flake, MD. past medical history significant for asthma, migraine, OCD, he was referred to us  because he passed out had happened in January he was doing pulmonary function test he was trying to exhale fast and deep and ended up passing out after that he felt lousy for all day he never had episode like this before.  Also after that he was noted to be bradycardic clinically looks like that was vasovagal event.  Since that time he seems to be doing fine.  He does have bronchial asthma walking and running he used to was getting short of breath if he did not use bronchodilators.  However he suffered from plantar fasciitis and running is out of question right now.  Denies have any chest pain tightness squeezing pressure burning chest.  He does have dyslipidemia with LDL of 131.  Does not smoke does not have family history of premature coronary artery disease he is not on any special diet  Past Medical History:  Diagnosis Date   Asthma    Hx of migraines    Occular   Hyperlipidemia     Past Surgical History:  Procedure Laterality Date   WISDOM TOOTH EXTRACTION      Current Medications: Current Meds  Medication Sig   albuterol  (VENTOLIN  HFA) 108 (90 Base) MCG/ACT inhaler Inhale 2 puffs into the lungs every 4 (four) hours as needed for wheezing or shortness of breath (coughing fits).   ARIPiprazole (ABILIFY) 10 MG tablet Take 10 mg by mouth daily.   BREO ELLIPTA  200-25 MCG/ACT AEPB Inhale 1 puff into the lungs daily.   citalopram  (CELEXA ) 40 MG tablet TAKE 1  TABLET(40 MG) BY MOUTH DAILY (Patient taking differently: Take 40 mg by mouth daily. TAKE 1 TABLET(40 MG) BY MOUTH DAILY)   fluticasone  (FLONASE ) 50 MCG/ACT nasal spray Place 2 sprays into both nostrils daily.   Fluticasone -Umeclidin-Vilant (TRELEGY ELLIPTA ) 200-62.5-25 MCG/ACT AEPB Inhale 1 puff into the lungs daily. Rinse mouth after each use.   ipratropium (ATROVENT ) 0.03 % nasal spray PLACE 1-2 SPRAYS INTO BOTH NOSTRILS 2 (TWO) TIMES DAILY AS NEEDED (NASAL DRAINAGE). (Patient taking differently: Place 1-2 sprays into both nostrils 2 (two) times daily as needed for rhinitis (nasal drainage).)   ketoconazole  (NIZORAL ) 2 % shampoo USE AS DIRECTED TWICE A WEEK (Patient taking differently: Apply 1 Application topically 2 (two) times a week.)   montelukast  (SINGULAIR ) 10 MG tablet Take 1 tablet by mouth at bedtime.   pantoprazole  (PROTONIX ) 40 MG tablet TAKE 1 TABLET BY MOUTH EVERY DAY     Allergies:   Prozac [fluoxetine hcl]   Social History   Socioeconomic History   Marital status: Married    Spouse name: Not on file   Number of children: Not on file   Years of education: Not on file   Highest education level: Not on file  Occupational History   Not on file  Tobacco Use   Smoking status: Never   Smokeless tobacco: Never  Vaping Use  Vaping status: Never Used  Substance and Sexual Activity   Alcohol use: Yes   Drug use: Never   Sexual activity: Yes    Partners: Female  Other Topics Concern   Not on file  Social History Narrative   Not on file   Social Drivers of Health   Financial Resource Strain: Not on file  Food Insecurity: Not on file  Transportation Needs: Not on file  Physical Activity: Not on file  Stress: Not on file  Social Connections: Not on file     Family History: The patient's family history includes Alcohol abuse in his maternal grandfather; Birth defects in his maternal grandfather; COPD in his father; Cancer in his mother; Early death in his maternal  grandfather and mother; Heart disease in his father and paternal grandmother; Hypertension in his mother; Migraines in his mother, sister, and sister; Stroke in his maternal grandmother and paternal grandfather. ROS:   Please see the history of present illness.    All 14 point review of systems negative except as described per history of present illness.  EKGs/Labs/Other Studies Reviewed:    The following studies were reviewed today:   EKG:  EKG Interpretation Date/Time:  Friday Dec 20 2023 13:48:46 EDT Ventricular Rate:  55 PR Interval:  194 QRS Duration:  90 QT Interval:  446 QTC Calculation: 426 R Axis:   87  Text Interpretation: Sinus bradycardia No previous ECGs available Confirmed by Ralene Burger [phone]) on 12/20/2023 1:53:56 PM    Recent Labs: No results found for requested labs within last 365 days.  Recent Lipid Panel    Component Value Date/Time   CHOL 204 (H) 05/16/2022 1133   TRIG 124.0 05/16/2022 1133   HDL 48.20 05/16/2022 1133   CHOLHDL 4 05/16/2022 1133   VLDL 24.8 05/16/2022 1133   LDLCALC 131 (H) 05/16/2022 1133    Physical Exam:    VS:  BP 124/80 (BP Location: Right Arm, Patient Position: Sitting)   Pulse (!) 54   Ht 6\' 2"  (1.88 m)   Wt 249 lb (112.9 kg)   SpO2 97%   BMI 31.97 kg/m     Wt Readings from Last 3 Encounters:  12/20/23 249 lb (112.9 kg)  08/06/23 253 lb (114.8 kg)  03/21/23 244 lb 8 oz (110.9 kg)     GEN:  Well nourished, well developed in no acute distress HEENT: Normal NECK: No JVD; No carotid bruits LYMPHATICS: No lymphadenopathy CARDIAC: RRR, no murmurs, no rubs, no gallops RESPIRATORY:  Clear to auscultation without rales, wheezing or rhonchi  ABDOMEN: Soft, non-tender, non-distended MUSCULOSKELETAL:  No edema; No deformity  SKIN: Warm and dry NEUROLOGIC:  Alert and oriented x 3 PSYCHIATRIC:  Normal affect   ASSESSMENT:    1. Vasovagal episode   2. Sinus bradycardia   3. Mixed hyperlipidemia   4. Moderate  persistent asthma without complication    PLAN:    In order of problems listed above:  Vasovagal syncope from the description he gave me it look like that is exactly what it was.  He never had it before.  He does have resting sinus bradycardia I will ask him to wear Zio patch to make sure he does not have any critical bradycardia which I do not think we will identified. He tells me that his father did have some congenital heart problem he is not exactly sure what it is but he said he need to be checked for it I schedule him to have an echocardiogram also  because of dyspnea on exertion we will make sure that his heart is structurally normal. Mixed dyslipidemia LDL 131 HDL 48 I will schedule him to have a calcium  score to determine what is his risk profile send if we need to treat his dyslipidemia.   Medication Adjustments/Labs and Tests Ordered: Current medicines are reviewed at length with the patient today.  Concerns regarding medicines are outlined above.  Orders Placed This Encounter  Procedures   EKG 12-Lead   No orders of the defined types were placed in this encounter.   Signed, Manfred Seed, MD, [facility]. 12/20/2023 2:13 PM    [facility]

## 2023-12-25 ENCOUNTER — Ambulatory Visit (INDEPENDENT_AMBULATORY_CARE_PROVIDER_SITE_OTHER)

## 2023-12-25 DIAGNOSIS — M722 Plantar fascial fibromatosis: Secondary | ICD-10-CM | POA: Diagnosis not present

## 2023-12-25 DIAGNOSIS — M2141 Flat foot [pes planus] (acquired), right foot: Secondary | ICD-10-CM

## 2023-12-25 DIAGNOSIS — M2142 Flat foot [pes planus] (acquired), left foot: Secondary | ICD-10-CM | POA: Diagnosis not present

## 2023-12-25 NOTE — Progress Notes (Signed)
  Patient was present and evaluated for Custom molded foot orthotics. Patient will benefit from CFO's to provide total contact to BIL MLA's helping to balance and distribute body weight more evenly across BIL feet helping to reduce plantar pressure and pain. Orthotic will also encourage FF / RF alignment  Patient was scanned today and will return for fitting upon receipt

## 2023-12-26 DIAGNOSIS — H9201 Otalgia, right ear: Secondary | ICD-10-CM | POA: Diagnosis not present

## 2023-12-26 DIAGNOSIS — R051 Acute cough: Secondary | ICD-10-CM | POA: Diagnosis not present

## 2023-12-26 DIAGNOSIS — Z03818 Encounter for observation for suspected exposure to other biological agents ruled out: Secondary | ICD-10-CM | POA: Diagnosis not present

## 2023-12-26 DIAGNOSIS — J019 Acute sinusitis, unspecified: Secondary | ICD-10-CM | POA: Diagnosis not present

## 2024-01-06 ENCOUNTER — Ambulatory Visit (HOSPITAL_BASED_OUTPATIENT_CLINIC_OR_DEPARTMENT_OTHER)
Admission: RE | Admit: 2024-01-06 | Discharge: 2024-01-06 | Disposition: A | Discharge status: Home / Self Care | Payer: Self-pay | Source: Ambulatory Visit | Attending: Cardiology | Admitting: Cardiology

## 2024-01-06 DIAGNOSIS — E782 Mixed hyperlipidemia: Secondary | ICD-10-CM | POA: Insufficient documentation

## 2024-01-13 ENCOUNTER — Ambulatory Visit: Payer: Self-pay | Admitting: Cardiology

## 2024-01-16 ENCOUNTER — Other Ambulatory Visit (HOSPITAL_COMMUNITY): Payer: Self-pay

## 2024-01-27 DIAGNOSIS — R55 Syncope and collapse: Secondary | ICD-10-CM | POA: Diagnosis not present

## 2024-01-28 ENCOUNTER — Ambulatory Visit (HOSPITAL_BASED_OUTPATIENT_CLINIC_OR_DEPARTMENT_OTHER)
Admission: RE | Admit: 2024-01-28 | Discharge: 2024-01-28 | Disposition: A | Discharge status: Home / Self Care | Source: Ambulatory Visit | Attending: Cardiology | Admitting: Cardiology

## 2024-01-28 ENCOUNTER — Ambulatory Visit

## 2024-01-28 DIAGNOSIS — R0609 Other forms of dyspnea: Secondary | ICD-10-CM | POA: Diagnosis not present

## 2024-01-28 NOTE — Progress Notes (Signed)
 Patient presents today to pick up custom molded foot orthotics, diagnosed with PF by Dr. Lamount.   Orthotics were dispensed and fit was satisfactory. Reviewed instructions for break-in and wear. Written instructions given to patient.  Patient will follow up as needed.   Lolita Schultze Cped, CFo, CFm   Ws given Richie orthotics

## 2024-01-29 LAB — ECHOCARDIOGRAM COMPLETE
AR max vel: 2.13 cm2
AV Area VTI: 2.3 cm2
AV Area mean vel: 2.16 cm2
AV Mean grad: 4.7 mmHg
AV Peak grad: 8.6 mmHg
Ao pk vel: 1.47 m/s
Area-P 1/2: 2.01 cm2
Calc EF: 66.8 %
S' Lateral: 3.3 cm
Single Plane A2C EF: 71.6 %
Single Plane A4C EF: 61.5 %

## 2024-01-30 ENCOUNTER — Telehealth: Payer: Self-pay

## 2024-01-30 NOTE — Telephone Encounter (Signed)
 Left message on My Chart with Ca Score results per Dr. Vanetta Shawl note. Routed to PCP.

## 2024-01-30 NOTE — Telephone Encounter (Signed)
Left message on My Chart with monitor results per Dr. Vanetta Shawl note. Routed to PCP.

## 2024-02-19 ENCOUNTER — Telehealth: Payer: Self-pay

## 2024-02-19 NOTE — Telephone Encounter (Signed)
 Pt viewed monitor results on My Chart per Dr. Vanetta Shawl note. Routed to PCP.

## 2024-02-19 NOTE — Telephone Encounter (Signed)
 Pt viewed Ca Score results on My Chart per Dr. Vanetta Shawl note. Routed to PCP.

## 2024-02-25 ENCOUNTER — Encounter: Payer: Self-pay | Admitting: Cardiology

## 2024-02-25 ENCOUNTER — Ambulatory Visit: Attending: Cardiology | Admitting: Cardiology

## 2024-02-25 VITALS — BP 123/85 | HR 60 | Ht 74.0 in | Wt 246.0 lb

## 2024-02-25 DIAGNOSIS — R55 Syncope and collapse: Secondary | ICD-10-CM

## 2024-02-25 DIAGNOSIS — R001 Bradycardia, unspecified: Secondary | ICD-10-CM

## 2024-02-25 DIAGNOSIS — R931 Abnormal findings on diagnostic imaging of heart and coronary circulation: Secondary | ICD-10-CM | POA: Diagnosis not present

## 2024-02-25 DIAGNOSIS — E782 Mixed hyperlipidemia: Secondary | ICD-10-CM | POA: Diagnosis not present

## 2024-02-25 MED ORDER — ATORVASTATIN CALCIUM 10 MG PO TABS: 10.0000 mg | ORAL_TABLET | Freq: Every day | ORAL | 3 refills | Status: DC

## 2024-02-25 NOTE — Patient Instructions (Addendum)
 Medication Instructions:   START: Lipitor 10mg  daily   Lab Work: 3rd Floor   [address]  Your physician recommends that you return for lab work in:  6 weeks  You need to have labs done when you are fasting.  You can come Monday through Friday 8:00 am to 11:30AM and 1:00 to 4:00. You do not need to make an appointment as the order has already been placed. The labs you are going to have done are Lipid, AST, ALT, LPa     Testing/Procedures:  Graded Exercise Stress Test  Eat a Light Breakfast  Dress To Exercise   Follow-Up: At [facility], you and your health needs are our priority.  As part of our continuing mission to provide you with exceptional heart care, we have created designated Provider Care Teams.  These Care Teams include your primary Cardiologist (physician) and Advanced Practice Providers (APPs -  Physician Assistants and Nurse Practitioners) who all work together to provide you with the care you need, when you need it.  We recommend signing up for the patient portal called MyChart.  Sign up information is provided on this After Visit Summary.  MyChart is used to connect with patients for Virtual Visits (Telemedicine).  Patients are able to view lab/test results, encounter notes, upcoming appointments, etc.  Non-urgent messages can be sent to your provider as well.   To learn more about what you can do with MyChart, go to [email or web address].    Your next appointment:   6 month(s)  The format for your next appointment:   In Person  Provider:   Lamar Fitch, MD    Other Instructions NA

## 2024-02-25 NOTE — Addendum Note (Signed)
 Addended by: ARLOA PLANAS D on: 02/25/2024 02:20 PM   Modules accepted: Orders

## 2024-02-25 NOTE — Progress Notes (Signed)
 Cardiology Office Note:    Date:  02/25/2024   ID:  Antonio House, DOB Nov 24, 1970, MRN [id number]  PCP:  [place], Betty G, MD  Cardiologist:  Lamar Fitch, MD    Referring MD: [place], Betty G, MD   Chief Complaint  Patient presents with   Results    History of Present Illness:    Antonio House is a 53 y.o. male past medical history significant for OCD, migraine headaches, dyslipidemia, he was referred to us  because he got episode of syncope when he was doing pulmonary function test, after that he felt very lousy he was also noted to be bradycardic looked vasovagal.  After that he had monitor put on monitor show no critical bradycardia no AV block, some episode of supraventricular tachycardia but only mild.  Does not need to be treated.  As a part of evaluation he did have echocardiogram done this is because of some family history of congenital heart issues.  Everything came as normal.  He also got a calcium  score done which came elevated.  He is coming here with his wife recently his father passed because of acute stroke and he is of course very shaken by that.  Past Medical History:  Diagnosis Date   Asthma    Hx of migraines    Occular   Hyperlipidemia     Past Surgical History:  Procedure Laterality Date   WISDOM TOOTH EXTRACTION      Current Medications: Current Meds  Medication Sig   albuterol  (VENTOLIN  HFA) 108 (90 Base) MCG/ACT inhaler Inhale 2 puffs into the lungs every 4 (four) hours as needed for wheezing or shortness of breath (coughing fits).   ARIPiprazole (ABILIFY) 10 MG tablet Take 10 mg by mouth daily.   BREO ELLIPTA  200-25 MCG/ACT AEPB Inhale 1 puff into the lungs daily.   citalopram  (CELEXA ) 40 MG tablet TAKE 1 TABLET(40 MG) BY MOUTH DAILY (Patient taking differently: Take 40 mg by mouth daily. TAKE 1 TABLET(40 MG) BY MOUTH DAILY)   fluticasone  (FLONASE ) 50 MCG/ACT nasal spray Place 2 sprays into both nostrils daily.    Fluticasone -Umeclidin-Vilant (TRELEGY ELLIPTA ) 200-62.5-25 MCG/ACT AEPB Inhale 1 puff into the lungs daily. Rinse mouth after each use.   ipratropium (ATROVENT ) 0.03 % nasal spray PLACE 1-2 SPRAYS INTO BOTH NOSTRILS 2 (TWO) TIMES DAILY AS NEEDED (NASAL DRAINAGE). (Patient taking differently: Place 1-2 sprays into both nostrils 2 (two) times daily as needed for rhinitis (nasal drainage).)   ketoconazole  (NIZORAL ) 2 % shampoo USE AS DIRECTED TWICE A WEEK (Patient taking differently: Apply 1 Application topically 2 (two) times a week.)   montelukast  (SINGULAIR ) 10 MG tablet Take 1 tablet by mouth at bedtime.   pantoprazole  (PROTONIX ) 40 MG tablet TAKE 1 TABLET BY MOUTH EVERY DAY     Allergies:   Prozac [fluoxetine hcl]   Social History   Socioeconomic History   Marital status: Married    Spouse name: Not on file   Number of children: Not on file   Years of education: Not on file   Highest education level: Not on file  Occupational History   Not on file  Tobacco Use   Smoking status: Never   Smokeless tobacco: Never  Vaping Use   Vaping status: Never Used  Substance and Sexual Activity   Alcohol use: Yes   Drug use: Never   Sexual activity: Yes    Partners: Female  Other Topics Concern   Not on file  Social History Narrative  Not on file   Social Drivers of Health   Financial Resource Strain: Not on file  Food Insecurity: Not on file  Transportation Needs: Not on file  Physical Activity: Not on file  Stress: Not on file  Social Connections: Not on file     Family History: The patient's family history includes Alcohol abuse in his maternal grandfather; Birth defects in his maternal grandfather; COPD in his father; Cancer in his mother; Early death in his maternal grandfather and mother; Heart disease in his father and paternal grandmother; Hypertension in his mother; Migraines in his mother, sister, and sister; Stroke in his maternal grandmother and paternal  grandfather. ROS:   Please see the history of present illness.    All 14 point review of systems negative except as described per history of present illness  EKGs/Labs/Other Studies Reviewed:         Recent Labs: No results found for requested labs within last 365 days.  Recent Lipid Panel    Component Value Date/Time   CHOL 204 (H) 05/16/2022 1133   TRIG 124.0 05/16/2022 1133   HDL 48.20 05/16/2022 1133   CHOLHDL 4 05/16/2022 1133   VLDL 24.8 05/16/2022 1133   LDLCALC 131 (H) 05/16/2022 1133    Physical Exam:    VS:  BP 123/85 (BP Location: Left Arm, Patient Position: Sitting)   Pulse 60   Ht 6' 2 (1.88 m)   Wt 246 lb (111.6 kg)   SpO2 98%   BMI 31.58 kg/m     Wt Readings from Last 3 Encounters:  02/25/24 246 lb (111.6 kg)  12/20/23 249 lb (112.9 kg)  08/06/23 253 lb (114.8 kg)     GEN:  Well nourished, well developed in no acute distress HEENT: Normal NECK: No JVD; No carotid bruits LYMPHATICS: No lymphadenopathy CARDIAC: RRR, no murmurs, no rubs, no gallops RESPIRATORY:  Clear to auscultation without rales, wheezing or rhonchi  ABDOMEN: Soft, non-tender, non-distended MUSCULOSKELETAL:  No edema; No deformity  SKIN: Warm and dry LOWER EXTREMITIES: no swelling NEUROLOGIC:  Alert and oriented x 3 PSYCHIATRIC:  Normal affect   ASSESSMENT:    1. Elevated coronary artery calcium  score 135 which is 88 percentile   2. Sinus bradycardia   3. Mixed hyperlipidemia   4. Syncope and collapse    PLAN:    In order of problems listed above:  Elevated coronary calcium  score we had a long discussion about it.  I did do calculation of his 10 years predicted risk which is 9.7% and I recommended statin.  Will start atorvastatin  10 mg daily, 6 weeks later we need to check fasting lipid profile AST ALT.  We did talk about healthy lifestyle need to exercise on the regular basis at least 5 times a week for at least 30 minutes moderate intensity exercise as well as we  discussed basic of Mediterranean diet. Sinus bradycardia.  Nothing critical.  I think he can go ahead and have colonoscopy done if he being bradycardic during the colonoscopy with heart rate less than 30 atropine 0.5 to 1 mg can be given intravenously.  He look like had a vagal event during the pulmonary function test. Mixed dyslipidemia plan as described above. Syncope and collapse discussion above He interested in starting an aggressive exercise program.  I will schedule him to have EKG treadmill stress test.   Medication Adjustments/Labs and Tests Ordered: Current medicines are reviewed at length with the patient today.  Concerns regarding medicines are outlined above.  No orders of the defined types were placed in this encounter.  Medication changes: No orders of the defined types were placed in this encounter.   Signed, Lamar DOROTHA Fitch, MD, [facility] 02/25/2024 2:10 PM    [facility]

## 2024-03-04 NOTE — Addendum Note (Signed)
 Addended by: BERNIE CHARLESTON on: 03/04/2024 02:13 PM   Modules accepted: Orders

## 2024-03-20 ENCOUNTER — Telehealth (HOSPITAL_COMMUNITY): Payer: Self-pay

## 2024-03-20 NOTE — Telephone Encounter (Signed)
 Detailed instructions given to the patient for his upcoming GXT test. S.Timberly Yott CCT

## 2024-03-24 ENCOUNTER — Ambulatory Visit (HOSPITAL_COMMUNITY)
Admission: RE | Admit: 2024-03-24 | Discharge: 2024-03-24 | Disposition: A | Discharge status: Home / Self Care | Source: Ambulatory Visit | Attending: Cardiovascular Disease | Admitting: Cardiovascular Disease

## 2024-03-24 DIAGNOSIS — R931 Abnormal findings on diagnostic imaging of heart and coronary circulation: Secondary | ICD-10-CM | POA: Insufficient documentation

## 2024-03-24 LAB — EXERCISE TOLERANCE TEST
Angina Index: 0
Duke Treadmill Score: 12
Estimated workload: 13.4
Exercise duration (min): 12 min
Exercise duration (sec): 0 s
MPHR: 166 {beats}/min
Peak HR: 142 {beats}/min
Percent HR: 85 %
RPE: 18
Rest HR: 54 {beats}/min
ST Depression (mm): 0 mm

## 2024-03-25 ENCOUNTER — Ambulatory Visit: Payer: Self-pay | Admitting: Cardiology

## 2024-04-09 DIAGNOSIS — J342 Deviated nasal septum: Secondary | ICD-10-CM | POA: Diagnosis not present

## 2024-04-09 DIAGNOSIS — J320 Chronic maxillary sinusitis: Secondary | ICD-10-CM | POA: Diagnosis not present

## 2024-04-28 ENCOUNTER — Encounter: Payer: Self-pay | Admitting: Cardiology

## 2024-04-30 ENCOUNTER — Telehealth: Payer: Self-pay

## 2024-04-30 MED ORDER — PRAVASTATIN SODIUM 20 MG PO TABS: 20.0000 mg | ORAL_TABLET | Freq: Every evening | ORAL | 3 refills | Status: DC

## 2024-04-30 NOTE — Telephone Encounter (Signed)
 Pravastatin 20mg  1 tablet daily and add Co Q 10 q day

## 2024-06-05 DIAGNOSIS — J342 Deviated nasal septum: Secondary | ICD-10-CM | POA: Diagnosis not present

## 2024-06-05 DIAGNOSIS — J320 Chronic maxillary sinusitis: Secondary | ICD-10-CM | POA: Diagnosis not present

## 2024-06-08 DIAGNOSIS — J320 Chronic maxillary sinusitis: Secondary | ICD-10-CM | POA: Diagnosis not present

## 2024-06-08 DIAGNOSIS — D500 Iron deficiency anemia secondary to blood loss (chronic): Secondary | ICD-10-CM | POA: Diagnosis not present

## 2024-06-16 DIAGNOSIS — D500 Iron deficiency anemia secondary to blood loss (chronic): Secondary | ICD-10-CM | POA: Diagnosis not present

## 2024-06-16 DIAGNOSIS — J320 Chronic maxillary sinusitis: Secondary | ICD-10-CM | POA: Diagnosis not present

## 2024-06-22 ENCOUNTER — Ambulatory Visit (AMBULATORY_SURGERY_CENTER)

## 2024-06-22 VITALS — Ht 74.0 in | Wt 245.0 lb

## 2024-06-22 DIAGNOSIS — Z1211 Encounter for screening for malignant neoplasm of colon: Secondary | ICD-10-CM

## 2024-06-22 MED ORDER — NA SULFATE-K SULFATE-MG SULF 17.5-3.13-1.6 GM/177ML PO SOLN: 1.0000 | Freq: Once | ORAL | 0 refills | Status: AC

## 2024-06-22 NOTE — Progress Notes (Signed)
 PCP MD at time of PV: Antonio Jordan, MD __________________________________________________________________________________________________________________________________________  No egg allergy known to patient  No soy allergy known to patient No issues known to pt with past sedation with any surgeries or procedures Patient denies ever being told they had issues or difficulty with intubation  No FH of Malignant Hyperthermia Pt is not on diet pills Pt is not on  home 02  Pt is not on blood thinners  No A fib or A flutter Have any cardiac testing pending--no LOA: independent  No Chew or Snuff tobacco __________________________________________________________________________________________________________________________________________  Constipation: no Prep: suprep  __________________________________________________________________________________________________________________________________________  PV completed with patient. Prep instructions reviewed and provided during apt. Rx sent to preferred pharmacy.  __________________________________________________________________________________________________________________________________________  Patient's chart reviewed by Norleen Schillings CNRA prior to previsit and patient appropriate for the [facility].  Previsit completed and red dot placed by patient's name on their procedure day (on provider's schedule).

## 2024-06-30 ENCOUNTER — Encounter: Payer: Self-pay | Admitting: Gastroenterology

## 2024-06-30 DIAGNOSIS — J342 Deviated nasal septum: Secondary | ICD-10-CM | POA: Diagnosis not present

## 2024-06-30 DIAGNOSIS — J320 Chronic maxillary sinusitis: Secondary | ICD-10-CM | POA: Diagnosis not present

## 2024-07-01 ENCOUNTER — Telehealth: Payer: Self-pay | Admitting: Gastroenterology

## 2024-07-01 NOTE — Telephone Encounter (Signed)
 Inbound call from patient wife stating that she would like to speak to someone in regards to how her husband procedure is coded and what is the difference between one or the other. Good call back number for patient wife is [phone]. Patient is scheduled for December the 8 th. Please advise.

## 2024-07-02 ENCOUNTER — Encounter: Payer: Self-pay | Admitting: Gastroenterology

## 2024-07-05 NOTE — Telephone Encounter (Signed)
 Patient called today to be sure that he was okay to proceed with bowel prep today as he had taken communion at church, consumed some grape juice and a wafer.  Advised fine to proceed with bowel prep for colonoscopy tomorrow

## 2024-07-06 ENCOUNTER — Encounter: Payer: Self-pay | Admitting: Gastroenterology

## 2024-07-06 ENCOUNTER — Ambulatory Visit: Admitting: Gastroenterology

## 2024-07-06 VITALS — BP 106/66 | HR 49 | Temp 97.9°F | Resp 21 | Ht 74.0 in | Wt 245.0 lb

## 2024-07-06 DIAGNOSIS — D124 Benign neoplasm of descending colon: Secondary | ICD-10-CM

## 2024-07-06 DIAGNOSIS — D128 Benign neoplasm of rectum: Secondary | ICD-10-CM | POA: Diagnosis not present

## 2024-07-06 DIAGNOSIS — Z1211 Encounter for screening for malignant neoplasm of colon: Secondary | ICD-10-CM | POA: Diagnosis not present

## 2024-07-06 MED ORDER — SODIUM CHLORIDE 0.9 % IV SOLN: 500.0000 mL | INTRAVENOUS | Status: DC

## 2024-07-06 NOTE — Progress Notes (Signed)
 Called to room to assist during endoscopic procedure.  Patient ID and intended procedure confirmed with present staff. Received instructions for my participation in the procedure from the performing physician.

## 2024-07-06 NOTE — Progress Notes (Signed)
 Pt's states no medical or surgical changes since previsit or office visit.

## 2024-07-06 NOTE — Progress Notes (Signed)
 GASTROENTEROLOGY PROCEDURE H&P NOTE   Primary Care Physician: Jordan, Betty G, MD    Reason for Procedure:  Colon Cancer screening  Plan:    Colonoscopy  Patient is appropriate for endoscopic procedure(s) in the ambulatory ([facility]) setting.  The nature of the procedure, as well as the risks, benefits, and alternatives were carefully and thoroughly reviewed with the patient. Ample time for discussion and questions allowed. The patient understood, was satisfied, and agreed to proceed. I personally addressed all patient questions and concerns.     HPI: Antonio House is a 53 y.o. male who presents for colonoscopy for routine Colon Cancer screening.  No active GI symptoms.  No known family history of colon cancer or related malignancy.  Patient is otherwise without complaints or active issues today.  Past Medical History:  Diagnosis Date   Asthma    Hx of migraines    Occular   Hyperlipidemia     Past Surgical History:  Procedure Laterality Date   WISDOM TOOTH EXTRACTION      Prior to Admission medications   Medication Sig Start Date End Date Taking? Authorizing Provider  albuterol  (VENTOLIN  HFA) 108 (90 Base) MCG/ACT inhaler Inhale 2 puffs into the lungs every 4 (four) hours as needed for wheezing or shortness of breath (coughing fits). 09/04/22   Luke Orlan HERO, DO  BREO ELLIPTA  200-25 MCG/ACT AEPB Inhale 1 puff into the lungs daily. 10/12/23   [provider]  citalopram  (CELEXA ) 40 MG tablet TAKE 1 TABLET(40 MG) BY MOUTH DAILY Patient taking differently: Take 40 mg by mouth daily. TAKE 1 TABLET(40 MG) BY MOUTH DAILY 08/14/23   Jordan, Betty G, MD  fluticasone  (FLONASE ) 50 MCG/ACT nasal spray Place 2 sprays into both nostrils daily. 09/04/22   Luke Orlan HERO, DO  Fluticasone -Umeclidin-Vilant (TRELEGY ELLIPTA ) 200-62.5-25 MCG/ACT AEPB Inhale 1 puff into the lungs daily. Rinse mouth after each use. Patient not taking: Reported on 06/22/2024 08/06/23   Luke Orlan HERO, DO   ipratropium (ATROVENT ) 0.03 % nasal spray PLACE 1-2 SPRAYS INTO BOTH NOSTRILS 2 (TWO) TIMES DAILY AS NEEDED (NASAL DRAINAGE). Patient taking differently: Place 1-2 sprays into both nostrils 2 (two) times daily as needed for rhinitis (nasal drainage). 05/28/23   Luke Orlan HERO, DO  ketoconazole  (NIZORAL ) 2 % shampoo USE AS DIRECTED TWICE A WEEK Patient taking differently: Apply 1 Application topically 2 (two) times a week. 08/20/23   Jordan, Betty G, MD  pravastatin  (PRAVACHOL ) 20 MG tablet Take 1 tablet (20 mg total) by mouth every evening. 04/30/24 07/29/24  Krasowski, Robert J, MD    Current Outpatient Medications  Medication Sig Dispense Refill   albuterol  (VENTOLIN  HFA) 108 (90 Base) MCG/ACT inhaler Inhale 2 puffs into the lungs every 4 (four) hours as needed for wheezing or shortness of breath (coughing fits). 18 g 1   BREO ELLIPTA  200-25 MCG/ACT AEPB Inhale 1 puff into the lungs daily.     citalopram  (CELEXA ) 40 MG tablet TAKE 1 TABLET(40 MG) BY MOUTH DAILY (Patient taking differently: Take 40 mg by mouth daily. TAKE 1 TABLET(40 MG) BY MOUTH DAILY) 90 tablet 3   fluticasone  (FLONASE ) 50 MCG/ACT nasal spray Place 2 sprays into both nostrils daily. 48 g 3   Fluticasone -Umeclidin-Vilant (TRELEGY ELLIPTA ) 200-62.5-25 MCG/ACT AEPB Inhale 1 puff into the lungs daily. Rinse mouth after each use. (Patient not taking: Reported on 06/22/2024) 60 each 5   ipratropium (ATROVENT ) 0.03 % nasal spray PLACE 1-2 SPRAYS INTO BOTH NOSTRILS 2 (TWO) TIMES DAILY AS  NEEDED (NASAL DRAINAGE). (Patient taking differently: Place 1-2 sprays into both nostrils 2 (two) times daily as needed for rhinitis (nasal drainage).) 30 mL 1   ketoconazole  (NIZORAL ) 2 % shampoo USE AS DIRECTED TWICE A WEEK (Patient taking differently: Apply 1 Application topically 2 (two) times a week.) 120 mL 3   pravastatin  (PRAVACHOL ) 20 MG tablet Take 1 tablet (20 mg total) by mouth every evening. 90 tablet 3   No current facility-administered  medications for this visit.    Allergies as of 07/06/2024 - Review Complete 07/06/2024  Allergen Reaction Noted   Prozac [fluoxetine hcl] Hives 12/03/2017    Family History  Problem Relation Age of Onset   Migraines Mother    Cancer Mother    Early death Mother    Hypertension Mother    COPD Father    Heart disease Father    Migraines Sister    Migraines Sister    Stroke Maternal Grandmother    Alcohol abuse Maternal Grandfather    Birth defects Maternal Grandfather    Early death Maternal Grandfather    Heart disease Paternal Grandmother    Stroke Paternal Grandfather    Colon cancer Neg Hx    Rectal cancer Neg Hx    Stomach cancer Neg Hx     Social History   Socioeconomic History   Marital status: Married    Spouse name: Not on file   Number of children: Not on file   Years of education: Not on file   Highest education level: Not on file  Occupational History   Not on file  Tobacco Use   Smoking status: Never   Smokeless tobacco: Never  Vaping Use   Vaping status: Never Used  Substance and Sexual Activity   Alcohol use: Yes   Drug use: Never   Sexual activity: Yes    Partners: Female  Other Topics Concern   Not on file  Social History Narrative   Not on file   Social Drivers of Health   Financial Resource Strain: Not on file  Food Insecurity: Not on file  Transportation Needs: Not on file  Physical Activity: Not on file  Stress: Not on file  Social Connections: Not on file  Intimate Partner Violence: Not on file    Physical Exam: Vital signs in last 24 hours: @There  were no vitals taken for this visit. GEN: NAD EYE: Sclerae anicteric ENT: MMM CV: Non-tachycardic Pulm: CTA b/l GI: Soft, NT/ND NEURO:  Alert & Oriented x 3   Sandor Flatter, DO [facility]   07/06/2024 10:08 AM

## 2024-07-06 NOTE — Patient Instructions (Signed)

## 2024-07-06 NOTE — Op Note (Signed)
 [facility] Patient Name: Antonio House Procedure Date: 07/06/2024 10:30 AM MRN: [id number] Endoscopist: Sandor Flatter , MD, [id number] Age: 53 Referring MD:  Date of Birth: Dec 06, 1970 Gender: Male Account #: [id number] Procedure:                Colonoscopy Indications:              Screening for colorectal malignant neoplasm, This                            is the patient's first colonoscopy Medicines:                Monitored Anesthesia Care Procedure:                Pre-Anesthesia Assessment:                           - Prior to the procedure, a History and Physical                            was performed, and patient medications and                            allergies were reviewed. The patient's tolerance of                            previous anesthesia was also reviewed. The risks                            and benefits of the procedure and the sedation                            options and risks were discussed with the patient.                            All questions were answered, and informed consent                            was obtained. Prior Anticoagulants: The patient has                            taken no anticoagulant or antiplatelet agents. ASA                            Grade Assessment: II - A patient with mild systemic                            disease. After reviewing the risks and benefits,                            the patient was deemed in satisfactory condition to                            undergo the procedure.  After obtaining informed consent, the colonoscope                            was passed under direct vision. Throughout the                            procedure, the patient's blood pressure, pulse, and                            oxygen saturations were monitored continuously. The                            [id number]-HQ190L Colonoscope [id number] was introduced                            through the anus and  advanced to the the cecum,                            identified by appendiceal orifice and ileocecal                            valve. The colonoscopy was performed without                            difficulty. The patient tolerated the procedure                            well. The quality of the bowel preparation was                            good. The ileocecal valve, appendiceal orifice, and                            rectum were photographed. Scope In: 10:40:37 AM Scope Out: 10:59:02 AM Scope Withdrawal Time: 0 hours 14 minutes 10 seconds  Total Procedure Duration: 0 hours 18 minutes 25 seconds  Findings:                 The perianal and digital rectal examinations were                            normal.                           A 3 mm polyp was found in the descending colon. The                            polyp was sessile. The polyp was removed with a                            cold snare. Resection and retrieval were complete.                            Estimated blood loss was minimal.  A 6 mm polyp was found in the rectum. The polyp was                            sessile. The polyp was removed with a cold snare.                            Resection and retrieval were complete. Estimated                            blood loss was minimal.                           The retroflexed view of the distal rectum and anal                            verge was otherwise normal and showed no additional                            anal or rectal abnormalities. Complications:            No immediate complications. Estimated Blood Loss:     Estimated blood loss was minimal. Impression:               - One 3 mm polyp in the descending colon, removed                            with a cold snare. Resected and retrieved.                           - One 6 mm polyp in the rectum, removed with a cold                            snare. Resected and retrieved.                            - The distal rectum and anal verge are normal on                            retroflexion view. Recommendation:           - Patient has a contact number available for                            emergencies. The signs and symptoms of potential                            delayed complications were discussed with the                            patient. Return to normal activities tomorrow.                            Written discharge instructions were provided to the  patient.                           - Resume previous diet.                           - Continue present medications.                           - Await pathology results.                           - Repeat colonoscopy for surveillance based on                            pathology results.                           - Return to GI office PRN. Sandor Flatter, MD 07/06/2024 11:04:50 AM

## 2024-07-06 NOTE — Progress Notes (Signed)
 Report to PACU, RN, vss, BBS= Clear.

## 2024-07-07 ENCOUNTER — Telehealth: Payer: Self-pay | Admitting: *Deleted

## 2024-07-07 NOTE — Telephone Encounter (Signed)
  Follow up Call-     07/06/2024   10:11 AM  Call back number  Post procedure Call Back phone  # [phone]  Permission to leave phone message Yes     Patient questions:  Do you have a fever, pain , or abdominal swelling? No. Pain Score  0 *  Have you tolerated food without any problems? Yes.    Have you been able to return to your normal activities? Yes.    Do you have any questions about your discharge instructions: Diet   No. Medications  No. Follow up visit  No.  Do you have questions or concerns about your Care? No.  Actions: * If pain score is 4 or above: No action needed, pain <4.

## 2024-07-08 LAB — SURGICAL PATHOLOGY

## 2024-07-13 ENCOUNTER — Ambulatory Visit: Payer: Self-pay | Admitting: Gastroenterology

## 2024-07-13 DIAGNOSIS — J342 Deviated nasal septum: Secondary | ICD-10-CM | POA: Diagnosis not present

## 2024-07-16 ENCOUNTER — Other Ambulatory Visit: Payer: Self-pay | Admitting: Family Medicine

## 2024-07-16 DIAGNOSIS — F429 Obsessive-compulsive disorder, unspecified: Secondary | ICD-10-CM

## 2024-07-17 ENCOUNTER — Other Ambulatory Visit: Payer: Self-pay | Admitting: Family Medicine

## 2024-07-17 DIAGNOSIS — F429 Obsessive-compulsive disorder, unspecified: Secondary | ICD-10-CM

## 2024-07-17 NOTE — Telephone Encounter (Signed)
 Copied from CRM #[id number]. Topic: Clinical - Medication Refill >> Jul 17, 2024  9:23 AM Pinkey ORN wrote: Medication: citalopram  (CELEXA ) 40 MG tablet  Has the patient contacted their pharmacy? Yes (Agent: If no, request that the patient contact the pharmacy for the refill. If patient does not wish to contact the pharmacy document the reason why and proceed with request.) (Agent: If yes, when and what did the pharmacy advise?)  This is the patient's preferred pharmacy:  [id number] - [place], [place] - [address] [place] [address] [place] [address] [place] [address] Phone: [phone] Fax: [phone]   Is this the correct pharmacy for this prescription? Yes If no, delete pharmacy and type the correct one.   Has the prescription been filled recently? Yes  Is the patient out of the medication? No  Has the patient been seen for an appointment in the last year OR does the patient have an upcoming appointment? Yes  Can we respond through MyChart? Yes  Agent: Please be advised that Rx refills may take up to 3 business days. We ask that you follow-up with your pharmacy. >> Jul 17, 2024  9:27 AM Pinkey ORN wrote: Patient states the medication has 2 different manufactures but only one works for him. Patient states the only one that works for him is the circular white 40 MG. Patient states the pharmacy is needing for Dr. Jordan to rewrite the prescription since he not too long ago picked up with prescription for the oval pink ones, but hasn't taken any of them.

## 2024-08-19 ENCOUNTER — Ambulatory Visit: Payer: Self-pay | Admitting: Family Medicine

## 2024-08-19 ENCOUNTER — Encounter: Payer: Self-pay | Admitting: Family Medicine

## 2024-08-19 ENCOUNTER — Ambulatory Visit: Admitting: Family Medicine

## 2024-08-19 VITALS — BP 132/86 | HR 61 | Temp 98.5°F | Resp 16 | Ht 74.0 in | Wt 245.8 lb

## 2024-08-19 DIAGNOSIS — E782 Mixed hyperlipidemia: Secondary | ICD-10-CM | POA: Diagnosis not present

## 2024-08-19 DIAGNOSIS — Z23 Encounter for immunization: Secondary | ICD-10-CM

## 2024-08-19 DIAGNOSIS — Z125 Encounter for screening for malignant neoplasm of prostate: Secondary | ICD-10-CM

## 2024-08-19 DIAGNOSIS — Z0000 Encounter for general adult medical examination without abnormal findings: Secondary | ICD-10-CM | POA: Diagnosis not present

## 2024-08-19 DIAGNOSIS — F429 Obsessive-compulsive disorder, unspecified: Secondary | ICD-10-CM

## 2024-08-19 DIAGNOSIS — Z13228 Encounter for screening for other metabolic disorders: Secondary | ICD-10-CM | POA: Diagnosis not present

## 2024-08-19 DIAGNOSIS — T466X5A Adverse effect of antihyperlipidemic and antiarteriosclerotic drugs, initial encounter: Secondary | ICD-10-CM | POA: Diagnosis not present

## 2024-08-19 DIAGNOSIS — M791 Myalgia, unspecified site: Secondary | ICD-10-CM

## 2024-08-19 DIAGNOSIS — Z1329 Encounter for screening for other suspected endocrine disorder: Secondary | ICD-10-CM

## 2024-08-19 DIAGNOSIS — Z130 Encounter for screening for diseases of the blood and blood-forming organs and certain disorders involving the immune mechanism: Secondary | ICD-10-CM

## 2024-08-19 DIAGNOSIS — L219 Seborrheic dermatitis, unspecified: Secondary | ICD-10-CM

## 2024-08-19 LAB — COMPREHENSIVE METABOLIC PANEL WITH GFR
ALT: 24 U/L (ref 3–53)
AST: 19 U/L (ref 5–37)
Albumin: 4.8 g/dL (ref 3.5–5.2)
Alkaline Phosphatase: 55 U/L (ref 39–117)
BUN: 13 mg/dL (ref 6–23)
CO2: 29 meq/L (ref 19–32)
Calcium: 9.7 mg/dL (ref 8.4–10.5)
Chloride: 103 meq/L (ref 96–112)
Creatinine, Ser: 1.13 mg/dL (ref 0.40–1.50)
GFR: 74.29 mL/min
Glucose, Bld: 83 mg/dL (ref 70–99)
Potassium: 4.2 meq/L (ref 3.5–5.1)
Sodium: 137 meq/L (ref 135–145)
Total Bilirubin: 1.3 mg/dL — ABNORMAL HIGH (ref 0.2–1.2)
Total Protein: 7.2 g/dL (ref 6.0–8.3)

## 2024-08-19 LAB — LIPID PANEL
Cholesterol: 218 mg/dL — ABNORMAL HIGH (ref 28–200)
HDL: 45.7 mg/dL
LDL Cholesterol: 139 mg/dL — ABNORMAL HIGH (ref 10–99)
NonHDL: 172.22
Total CHOL/HDL Ratio: 5
Triglycerides: 168 mg/dL — ABNORMAL HIGH (ref 10.0–149.0)
VLDL: 33.6 mg/dL (ref 0.0–40.0)

## 2024-08-19 LAB — PSA: PSA: 0.78 ng/mL (ref 0.10–4.00)

## 2024-08-19 LAB — HEMOGLOBIN A1C: Hgb A1c MFr Bld: 5.7 % (ref 4.6–6.5)

## 2024-08-19 MED ORDER — CITALOPRAM HYDROBROMIDE 40 MG PO TABS: 40.0000 mg | ORAL_TABLET | Freq: Every day | ORAL | 3 refills | Status: AC

## 2024-08-19 MED ORDER — KETOCONAZOLE 2 % EX SHAM: MEDICATED_SHAMPOO | CUTANEOUS | 3 refills | Status: AC

## 2024-08-19 MED ORDER — BEMPEDOIC ACID-EZETIMIBE 180-10 MG PO TABS: 1.0000 | ORAL_TABLET | Freq: Every day | ORAL | 0 refills | Status: AC

## 2024-08-19 NOTE — Assessment & Plan Note (Signed)
 In general problem is adequately controlled with current regimen. He has noticed that a particular type of generic medication seems to be more effective, recommend requesting this one in particular every time he requests a refill, I placed a note on prescription for pharmacist information. Continue Celexa  40 mg daily. As far as problem is stable, we can continue annual follow-ups.

## 2024-08-19 NOTE — Progress Notes (Signed)
 "   Chief Complaint  Patient presents with   Annual Exam   Discussed the use of AI scribe software for clinical note transcription with the patient, who gave verbal consent to proceed. History of Present Illness Antonio House is a 54 year old male with PMHx significant for elevated coronary artery calcium  score, hyperlipidemia, sinus bradycardia, chronic rhinitis, and OCD here today for his routine physical examination.  Last CPE: 05/16/22.  Since his last visit he has seen gastroenterologist, ENT, podiatrist, and cardiologist. He underwent septoplasty, left maxillary antrostomy, and ethmoidectomy on 06/05/2024.  He has experienced foot pain due to plantar fasciitis, which resolved with the use of inserts.  He has not engaged in deliberate exercise recently due to this pain but plans to resume now that it has subsided.  His diet includes eating out frequently due to home renovations, but he consumes vegetables and fruits daily.  He sleeps approximately eight hours per night. He drinks wine every other day, about two glasses a couple of times a week, and does not smoke.  Immunization History  Administered Date(s) Administered   Influenza, Mdck, Trivalent,PF 6+ MOS(egg free) 07/28/2024   Influenza,inj,Quad PF,6+ Mos 04/10/2021, 05/16/2022   PFIZER(Purple Top)SARS-COV-2 Vaccination 10/20/2019, 11/10/2019, 05/19/2020   Tdap 01/15/2018   Zoster Recombinant(Shingrix ) 05/16/2022   Health Maintenance  Topic Date Due   HIV Screening  Never done   Pneumococcal Vaccine: 50+ Years (1 of 2 - PCV) Never done   Hepatitis B Vaccines 19-59 Average Risk (1 of 3 - 19+ 3-dose series) Never done   Zoster Vaccines- Shingrix  (2 of 2) 07/11/2022   COVID-19 Vaccine (4 - 2025-26 season) 03/30/2024   DTaP/Tdap/Td (2 - Td or Tdap) 01/16/2028   Colonoscopy  07/07/2031   Influenza Vaccine  Completed   HPV VACCINES (No Doses Required) Completed   Hepatitis C Screening  Completed    Meningococcal B Vaccine  Aged Out   Lab Results  Component Value Date   PSA 0.47 05/16/2022   PSA 1.80 04/10/2021   He passed out during a breathing test at his immunologist's clinic, leading to a cardiology evaluation.  Abnormal coronary calcium  score, 135, so he was prescribed a statin. However, he discontinued it due to muscle aches and lethargy. He has tried atorvastatin  and Pravastatin , both causing similar side effects.  His LDL cholesterol was reported as 131 mg/dL.  Lab Results  Component Value Date   CHOL 204 (H) 05/16/2022   HDL 48.20 05/16/2022   LDLCALC 131 (H) 05/16/2022   TRIG 124.0 05/16/2022   CHOLHDL 4 05/16/2022   Lab Results  Component Value Date   NA 138 05/16/2022   CL 105 05/16/2022   K 4.2 05/16/2022   CO2 27 05/16/2022   BUN 11 05/16/2022   CREATININE 1.09 05/16/2022   GFR 78.82 05/16/2022   CALCIUM  9.5 05/16/2022   GLUCOSE 85 05/16/2022   OCD: He is currently taking citalopram  40 mg daily, noting a difference in efficacy between manufacturers, preferring the white round tablet. In general he feels the medication is still helping.  Asthma and seasonal allergies: He sees Dr. Luke, immunologist.  Review of Systems  Constitutional:  Negative for activity change, appetite change and fever.  HENT:  Positive for postnasal drip. Negative for dental problem, mouth sores, sore throat, trouble swallowing and voice change.   Eyes:  Negative for redness and visual disturbance.  Respiratory:  Positive for cough (occasional). Negative for shortness of breath and wheezing.   Cardiovascular:  Negative for chest pain, palpitations and leg swelling.  Gastrointestinal:  Negative for abdominal pain, blood in stool, nausea and vomiting.  Endocrine: Negative for cold intolerance, heat intolerance, polydipsia, polyphagia and polyuria.  Genitourinary:  Negative for decreased urine volume, dysuria, genital sores, hematuria and testicular pain.  Musculoskeletal:  Negative  for gait problem and myalgias.  Skin:  Negative for color change and rash.  Allergic/Immunologic: Positive for environmental allergies.  Neurological:  Negative for syncope, weakness, numbness and headaches.  Hematological:  Negative for adenopathy. Does not bruise/bleed easily.  Psychiatric/Behavioral:  Negative for confusion, hallucinations and sleep disturbance.   All other systems reviewed and are negative.  Medications Ordered Prior to Encounter[1]  Past Medical History:  Diagnosis Date   Asthma    Hx of migraines    Occular   Hyperlipidemia    Past Surgical History:  Procedure Laterality Date   WISDOM TOOTH EXTRACTION     Allergies[2]  Family History  Problem Relation Age of Onset   Migraines Mother    Cancer Mother    Early death Mother    Hypertension Mother    COPD Father    Heart disease Father    Migraines Sister    Migraines Sister    Stroke Maternal Grandmother    Alcohol abuse Maternal Grandfather    Birth defects Maternal Grandfather    Early death Maternal Grandfather    Heart disease Paternal Grandmother    Stroke Paternal Grandfather    Colon cancer Neg Hx    Rectal cancer Neg Hx    Stomach cancer Neg Hx     Social History   Socioeconomic History   Marital status: Married    Spouse name: Not on file   Number of children: Not on file   Years of education: Not on file   Highest education level: Not on file  Occupational History   Not on file  Tobacco Use   Smoking status: Never   Smokeless tobacco: Never  Vaping Use   Vaping status: Never Used  Substance and Sexual Activity   Alcohol use: Yes   Drug use: Never   Sexual activity: Yes    Partners: Female  Other Topics Concern   Not on file  Social History Narrative   Not on file   Social Drivers of Health   Tobacco Use: Low Risk (08/19/2024)   Patient History    Smoking Tobacco Use: Never    Smokeless Tobacco Use: Never    Passive Exposure: Not on file  Financial Resource  Strain: Not on file  Food Insecurity: Not on file  Transportation Needs: Not on file  Physical Activity: Not on file  Stress: Not on file  Social Connections: Not on file  Depression (PHQ2-9): Low Risk (08/19/2024)   Depression (PHQ2-9)    PHQ-2 Score: 0  Alcohol Screen: Not on file  Housing: Not on file  Utilities: Not on file  Health Literacy: Not on file   Vitals:   08/19/24 0905  BP: 132/86  Pulse: 61  Resp: 16  Temp: 98.5 F (36.9 C)  SpO2: 97%   Body mass index is 31.56 kg/m.  Wt Readings from Last 3 Encounters:  08/19/24 245 lb 12.8 oz (111.5 kg)  07/06/24 245 lb (111.1 kg)  06/22/24 245 lb (111.1 kg)   Physical Exam Vitals and nursing note reviewed.  Constitutional:      General: He is not in acute distress.    Appearance: He is well-developed.  HENT:  Head: Normocephalic and atraumatic.     Right Ear: Tympanic membrane, ear canal and external ear normal.     Left Ear: Tympanic membrane, ear canal and external ear normal.     Mouth/Throat:     Mouth: Mucous membranes are moist.     Pharynx: Oropharynx is clear.  Eyes:     Extraocular Movements: Extraocular movements intact.     Conjunctiva/sclera: Conjunctivae normal.     Pupils: Pupils are equal, round, and reactive to light.  Neck:     Thyroid : No thyroid  mass or thyromegaly.  Cardiovascular:     Rate and Rhythm: Normal rate and regular rhythm.     Pulses:          Dorsalis pedis pulses are 2+ on the right side and 2+ on the left side.     Heart sounds: No murmur heard. Pulmonary:     Effort: Pulmonary effort is normal. No respiratory distress.     Breath sounds: Normal breath sounds.  Abdominal:     Palpations: Abdomen is soft. There is no hepatomegaly or mass.     Tenderness: There is no abdominal tenderness.  Genitourinary:    Comments: No concerns. Musculoskeletal:        General: No tenderness.     Cervical back: Normal range of motion.     Comments: No signs of synovitis.   Lymphadenopathy:     Cervical: No cervical adenopathy.     Upper Body:     Right upper body: No supraclavicular adenopathy.     Left upper body: No supraclavicular adenopathy.  Skin:    General: Skin is warm.     Findings: No erythema.  Neurological:     General: No focal deficit present.     Mental Status: He is alert and oriented to person, place, and time.     Cranial Nerves: No cranial nerve deficit.     Sensory: No sensory deficit.     Gait: Gait normal.     Deep Tendon Reflexes:     Reflex Scores:      Bicep reflexes are 2+ on the right side and 2+ on the left side.      Patellar reflexes are 2+ on the right side and 2+ on the left side. Psychiatric:        Mood and Affect: Mood and affect normal.   ASSESSMENT AND PLAN:  Mr. Claudell Wohler was seen today for annual exam.  Diagnoses and all orders for this visit: Orders Placed This Encounter  Procedures   Pneumococcal conjugate vaccine 20-valent (Prevnar 20)   Varicella-zoster vaccine IM   Comprehensive metabolic panel with GFR   PSA   Hemoglobin A1c   Lipid panel   Lab Results  Component Value Date   CHOL 218 (H) 08/19/2024   HDL 45.70 08/19/2024   LDLCALC 139 (H) 08/19/2024   TRIG 168.0 (H) 08/19/2024   CHOLHDL 5 08/19/2024   Lab Results  Component Value Date   NA 137 08/19/2024   CL 103 08/19/2024   K 4.2 08/19/2024   CO2 29 08/19/2024   BUN 13 08/19/2024   CREATININE 1.13 08/19/2024   GFR 74.29 08/19/2024   CALCIUM  9.7 08/19/2024   ALBUMIN 4.8 08/19/2024   GLUCOSE 83 08/19/2024   Lab Results  Component Value Date   ALT 24 08/19/2024   AST 19 08/19/2024   ALKPHOS 55 08/19/2024   BILITOT 1.3 (H) 08/19/2024   Lab Results  Component Value Date  PSA 0.78 08/19/2024   PSA 0.47 05/16/2022   PSA 1.80 04/10/2021   Lab Results  Component Value Date   HGBA1C 5.7 08/19/2024   Routine general medical examination at a health care facility Assessment & Plan: We discussed the importance of  regular physical activity and healthy diet for prevention of chronic illness and/or complications. Preventive guidelines reviewed. Vaccination updated, second Shingrix  and Prevnar 20 given today. Next CPE in a year.   Seborrheic dermatitis Assessment & Plan: Problem is stable. Continue ketoconazole  shampoo 2%.  Orders: -     Ketoconazole ; Apply topically 2 (two) times a week. As needed  Dispense: 120 mL; Refill: 3  Obsessive-compulsive disorder, unspecified type Assessment & Plan: In general problem is adequately controlled with current regimen. He has noticed that a particular type of generic medication seems to be more effective, recommend requesting this one in particular every time he requests a refill, I placed a note on prescription for pharmacist information. Continue Celexa  40 mg daily. As far as problem is stable, we can continue annual follow-ups.  Orders: -     Citalopram  Hydrobromide; Take 1 tablet (40 mg total) by mouth daily. TAKE 1 TABLET(40 MG) BY MOUTH DAILY  Dispense: 90 tablet; Refill: 3  Screening for endocrine, metabolic and immunity disorder -     Comprehensive metabolic panel with GFR; Future -     Hemoglobin A1c; Future  Myalgia due to statin Assessment & Plan: Generalized myalgias with pravastatin  and atorvastatin .   Mixed hyperlipidemia Assessment & Plan: Elevated coronary calcium  score. He has not tolerated statins. Recommend bempedoic acid - ezetimibe  180-10 mg daily. Continue low-fat diet.  Orders: -     Comprehensive metabolic panel with GFR; Future -     Bempedoic Acid -Ezetimibe ; Take 1 tablet by mouth daily at 12 noon.  Dispense: 90 tablet; Refill: 0 -     Lipid panel; Future  Prostate cancer screening -     PSA; Future  Immunization due -     Pneumococcal conjugate vaccine 20-valent -     Varicella-zoster vaccine IM   Return in 1 year (on 08/19/2025) for CPE.  Lekeisha Arenas G. Shauntae Reitman, MD  [facility]. [place] office.      [1]  Current Outpatient Medications on File Prior to Visit  Medication Sig Dispense Refill   ACETAMINOPHEN ER PO Take 500 mg by mouth every 8 (eight) hours as needed for pain.     albuterol  (VENTOLIN  HFA) 108 (90 Base) MCG/ACT inhaler Inhale 2 puffs into the lungs every 4 (four) hours as needed for wheezing or shortness of breath (coughing fits). 18 g 1   BREO ELLIPTA  200-25 MCG/ACT AEPB Inhale 1 puff into the lungs daily.     citalopram  (CELEXA ) 40 MG tablet Take 1 tablet (40 mg total) by mouth daily. TAKE 1 TABLET(40 MG) BY MOUTH DAILY 90 tablet 0   fluticasone  (FLONASE ) 50 MCG/ACT nasal spray Place 2 sprays into both nostrils daily. (Patient taking differently: Place 2 sprays into both nostrils daily. As needed) 48 g 3   Fluticasone -Umeclidin-Vilant (TRELEGY ELLIPTA ) 200-62.5-25 MCG/ACT AEPB Inhale 1 puff into the lungs daily. Rinse mouth after each use. 60 each 5   ketoconazole  (NIZORAL ) 2 % shampoo USE AS DIRECTED TWICE A WEEK (Patient taking differently: As needed) 120 mL 3   ipratropium (ATROVENT ) 0.03 % nasal spray PLACE 1-2 SPRAYS INTO BOTH NOSTRILS 2 (TWO) TIMES DAILY AS NEEDED (NASAL DRAINAGE). (Patient not taking: Reported on 08/19/2024) 30 mL 1   pravastatin  (PRAVACHOL ) 20  MG tablet Take 1 tablet (20 mg total) by mouth every evening. (Patient not taking: Reported on 08/19/2024) 90 tablet 3   No current facility-administered medications on file prior to visit.  [2]  Allergies Allergen Reactions   Prozac [Fluoxetine Hcl] Hives   "

## 2024-08-19 NOTE — Assessment & Plan Note (Signed)
 Problem is stable. Continue ketoconazole  shampoo 2%.

## 2024-08-19 NOTE — Assessment & Plan Note (Signed)
 Generalized myalgias with pravastatin  and atorvastatin .

## 2024-08-19 NOTE — Assessment & Plan Note (Signed)
 We discussed the importance of regular physical activity and healthy diet for prevention of chronic illness and/or complications. Preventive guidelines reviewed. Vaccination updated, second Shingrix  and Prevnar 20 given today. Next CPE in a year.

## 2024-08-19 NOTE — Assessment & Plan Note (Signed)
 Elevated coronary calcium  score. He has not tolerated statins. Recommend bempedoic acid - ezetimibe  180-10 mg daily. Continue low-fat diet.

## 2024-08-19 NOTE — Patient Instructions (Addendum)
 A few things to remember from today's visit:  Routine general medical examination at a health care facility  Seborrheic dermatitis - Plan: ketoconazole  (NIZORAL ) 2 % shampoo  Obsessive-compulsive disorder, unspecified type - Plan: citalopram  (CELEXA ) 40 MG tablet  Screening for endocrine, metabolic and immunity disorder - Plan: Comprehensive metabolic panel with GFR, Hemoglobin A1c  Myalgia due to statin  Mixed hyperlipidemia - Plan: Comprehensive metabolic panel with GFR, Bempedoic Acid -Ezetimibe  180-10 MG TABS  Prostate cancer screening - Plan: PSA  Immunization due - Plan: Pneumococcal conjugate vaccine 20-valent (Prevnar 20), Varicella-zoster vaccine IM  New cholesterol med sent. No changes in rest. Continue following with ENT as needed and with immunologist for your asthma.  If you need refills for medications you take chronically, please call your pharmacy. Do not use My Chart to request refills or for acute issues that need immediate attention. If you send a my chart message, it may take a few days to be addressed, specially if I am not in the office.  Please be sure medication list is accurate. If a new problem present, please set up appointment sooner than planned today.  Health Maintenance, Male Adopting a healthy lifestyle and getting preventive care are important in promoting health and wellness. Ask your health care provider about: The right schedule for you to have regular tests and exams. Things you can do on your own to prevent diseases and keep yourself healthy. What should I know about diet, weight, and exercise? Eat a healthy diet  Eat a diet that includes plenty of vegetables, fruits, low-fat dairy products, and lean protein. Do not eat a lot of foods that are high in solid fats, added sugars, or sodium. Maintain a healthy weight Body mass index (BMI) is a measurement that can be used to identify possible weight problems. It estimates body fat based on  height and weight. Your health care provider can help determine your BMI and help you achieve or maintain a healthy weight. Get regular exercise Get regular exercise. This is one of the most important things you can do for your health. Most adults should: Exercise for at least 150 minutes each week. The exercise should increase your heart rate and make you sweat (moderate-intensity exercise). Do strengthening exercises at least twice a week. This is in addition to the moderate-intensity exercise. Spend less time sitting. Even light physical activity can be beneficial. Watch cholesterol and blood lipids Have your blood tested for lipids and cholesterol at 54 years of age, then have this test every 5 years. You may need to have your cholesterol levels checked more often if: Your lipid or cholesterol levels are high. You are older than 54 years of age. You are at high risk for heart disease. What should I know about cancer screening? Many types of cancers can be detected early and may often be prevented. Depending on your health history and family history, you may need to have cancer screening at various ages. This may include screening for: Colorectal cancer. Prostate cancer. Skin cancer. Lung cancer. What should I know about heart disease, diabetes, and high blood pressure? Blood pressure and heart disease High blood pressure causes heart disease and increases the risk of stroke. This is more likely to develop in people who have high blood pressure readings or are overweight. Talk with your health care provider about your target blood pressure readings. Have your blood pressure checked: Every 3-5 years if you are 85-58 years of age. Every year if you are 40 years  old or older. If you are between the ages of 26 and 63 and are a current or former smoker, ask your health care provider if you should have a one-time screening for abdominal aortic aneurysm (AAA). Diabetes Have regular diabetes  screenings. This checks your fasting blood sugar level. Have the screening done: Once every three years after age 69 if you are at a normal weight and have a low risk for diabetes. More often and at a younger age if you are overweight or have a high risk for diabetes. What should I know about preventing infection? Hepatitis B If you have a higher risk for hepatitis B, you should be screened for this virus. Talk with your health care provider to find out if you are at risk for hepatitis B infection. Hepatitis C Blood testing is recommended for: Everyone born from [birth year] through 1965. Anyone with known risk factors for hepatitis C. Sexually transmitted infections (STIs) You should be screened each year for STIs, including gonorrhea and chlamydia, if: You are sexually active and are younger than 54 years of age. You are older than 54 years of age and your health care provider tells you that you are at risk for this type of infection. Your sexual activity has changed since you were last screened, and you are at increased risk for chlamydia or gonorrhea. Ask your health care provider if you are at risk. Ask your health care provider about whether you are at high risk for HIV. Your health care provider may recommend a prescription medicine to help prevent HIV infection. If you choose to take medicine to prevent HIV, you should first get tested for HIV. You should then be tested every 3 months for as long as you are taking the medicine. Follow these instructions at home: Alcohol use Do not drink alcohol if your health care provider tells you not to drink. If you drink alcohol: Limit how much you have to 0-2 drinks a day. Know how much alcohol is in your drink. In the [place] one drink equals one 12 oz bottle of beer (355 mL), one 5 oz glass of wine (148 mL), or one 1 oz glass of hard liquor (44 mL). Lifestyle Do not use any products that contain nicotine or tobacco. These products include cigarettes,  chewing tobacco, and vaping devices, such as e-cigarettes. If you need help quitting, ask your health care provider. Do not use street drugs. Do not share needles. Ask your health care provider for help if you need support or information about quitting drugs. General instructions Schedule regular health, dental, and eye exams. Stay current with your vaccines. Tell your health care provider if: You often feel depressed. You have ever been abused or do not feel safe at home. Summary Adopting a healthy lifestyle and getting preventive care are important in promoting health and wellness. Follow your health care provider's instructions about healthy diet, exercising, and getting tested or screened for diseases. Follow your health care provider's instructions on monitoring your cholesterol and blood pressure. This information is not intended to replace advice given to you by your health care provider. Make sure you discuss any questions you have with your health care provider. Document Revised: 12/05/2020 Document Reviewed: 12/05/2020 Elsevier Patient Education  2024 [employer].

## 2024-08-21 ENCOUNTER — Other Ambulatory Visit (HOSPITAL_COMMUNITY): Payer: Self-pay

## 2024-08-21 ENCOUNTER — Telehealth: Payer: Self-pay

## 2024-08-21 NOTE — Telephone Encounter (Signed)
 Pharmacy Patient Advocate Encounter   Received notification from [facility] KEY that prior authorization for Nexlizet 180-10 tabs is required/requested.   Insurance verification completed.   The patient is insured through [employer].   Per test claim: PA required; PA submitted to above mentioned insurance via Latent Key/confirmation #/EOC [id number] Status is pending

## 2024-08-24 ENCOUNTER — Other Ambulatory Visit (HOSPITAL_COMMUNITY): Payer: Self-pay

## 2024-12-01 ENCOUNTER — Other Ambulatory Visit
